# Patient Record
Sex: Female | Born: 1935 | Race: White | Hispanic: No | Smoking: Never smoker
Health system: Southern US, Community
[De-identification: ages and names within clinical notes are randomized; demographics above are authoritative.]

## PROBLEM LIST (undated history)

## (undated) DIAGNOSIS — M359 Systemic involvement of connective tissue, unspecified: Secondary | ICD-10-CM

## (undated) DIAGNOSIS — I2510 Atherosclerotic heart disease of native coronary artery without angina pectoris: Secondary | ICD-10-CM

## (undated) DIAGNOSIS — I10 Essential (primary) hypertension: Secondary | ICD-10-CM

## (undated) DIAGNOSIS — E119 Type 2 diabetes mellitus without complications: Secondary | ICD-10-CM

## (undated) DIAGNOSIS — N289 Disorder of kidney and ureter, unspecified: Secondary | ICD-10-CM

## (undated) DIAGNOSIS — J449 Chronic obstructive pulmonary disease, unspecified: Secondary | ICD-10-CM

## (undated) DIAGNOSIS — E785 Hyperlipidemia, unspecified: Secondary | ICD-10-CM

---

## 2003-10-02 ENCOUNTER — Other Ambulatory Visit: Payer: Self-pay

## 2004-06-29 ENCOUNTER — Ambulatory Visit: Payer: Self-pay | Admitting: Nurse Practitioner

## 2005-08-02 ENCOUNTER — Ambulatory Visit: Payer: Self-pay | Admitting: Nurse Practitioner

## 2006-08-08 ENCOUNTER — Ambulatory Visit: Payer: Self-pay | Admitting: Nurse Practitioner

## 2006-12-05 ENCOUNTER — Ambulatory Visit: Payer: Self-pay | Admitting: Family Medicine

## 2006-12-06 ENCOUNTER — Ambulatory Visit: Payer: Self-pay | Admitting: Family Medicine

## 2006-12-19 ENCOUNTER — Inpatient Hospital Stay (HOSPITAL_COMMUNITY): Admission: EM | Admit: 2006-12-19 | Discharge: 2006-12-25 | Payer: Self-pay | Admitting: Emergency Medicine

## 2006-12-19 ENCOUNTER — Ambulatory Visit: Payer: Self-pay | Admitting: Internal Medicine

## 2006-12-19 ENCOUNTER — Ambulatory Visit: Payer: Self-pay | Admitting: Cardiology

## 2006-12-20 ENCOUNTER — Ambulatory Visit: Payer: Self-pay | Admitting: Vascular Surgery

## 2006-12-21 ENCOUNTER — Ambulatory Visit: Payer: Self-pay | Admitting: Physical Medicine & Rehabilitation

## 2006-12-22 ENCOUNTER — Encounter: Payer: Self-pay | Admitting: Cardiology

## 2007-07-30 ENCOUNTER — Ambulatory Visit: Payer: Self-pay | Admitting: Family Medicine

## 2007-08-14 ENCOUNTER — Ambulatory Visit: Payer: Self-pay | Admitting: Family Medicine

## 2008-03-22 ENCOUNTER — Inpatient Hospital Stay: Payer: Self-pay | Admitting: Internal Medicine

## 2008-04-26 ENCOUNTER — Ambulatory Visit: Payer: Self-pay | Admitting: Geriatric Medicine

## 2010-06-29 NOTE — Consult Note (Signed)
[name], [name]               ACCOUNT NO.:  [id number]   MEDICAL RECORD NO.:  [id number]          PATIENT TYPE:  INP   LOCATION:  4706                         FACILITY:  [facility]   PHYSICIAN:  Bevelyn Buckles. Bensimhon, MDDATE OF BIRTH:  04/07/1925   DATE OF CONSULTATION:  12/20/2006  DATE OF DISCHARGE:                                 CONSULTATION   REQUESTING PHYSICIAN:  Madelaine Etienne, M.D. of the Internal Medicine  Teaching Service.   REASON FOR CONSULTATION:  Elevated troponin.   PRIMARY CARE PHYSICIAN:  Gerome Apley, M.D.   NOTE:  The patient is new to [facility].   HISTORY OF THE PRESENT ILLNESS:  The patient is a 77 year old woman with  history of hypertension, hyperlipidemia and diabetes.  She denies any  history of know heart disease.  Per the chart she has been having  recurrent syncopal episodes.  The more recent episode was when she went  out to the porch to get a newspaper, came back to her room, fell down  and hurt her left hip.  According to the chart she denies tripping over  any objects and she did not lose consciousness.  There was no evidence  of seizure activity.  I have tried to ask her about these events and she  cannot  recall anything about the event, she just states that she just  falls down.  She denies any palpitations, chest pain or shortness of  breath that she can recall with these episodes.  She came to the  hospital where he was found to have a left hip fracture.  She was seen  by orthopedics and underwent repair.  Apparently cardiac enzymes were  checked for unclear reasons and it was noted that her troponin was 0.1  postoperatively and then this evening it was checked again and it as  0.17.  Her MBs have been negative.  She has not been complaining of any  chest pain or shortness of breath.   REVIEW OF SYSTEMS:  The review of systems is notable for hip pain.  She  denies any orthopnea.  No PND.  No lower extremity edema.  No focal  neurologic  defects.  No fevers or chills.  She has had weight loss and  constipation.   PAST MEDICAL HISTORY:  The past medical history is notable for:  1. Hypertension.  2. Hyperlipidemia.  3. Diabetes.   MEDICATIONS:  The patient's medications on admission consists of:  1. Aspirin 325 mg a day.  2. Carisoprodol 1-2 tablets three times a day.  3. Glipizide 5 mg a day.  4. Lovastatin 20 mg a day.  5. Metformin 1 gram twice a day.  6. Trazodone 50 mg a day.  7. Quinapril 40 mg a day.   ALLERGIES:  The patient has no known drug allergies.   SOCIAL HISTORY:  The patient lives alone.  Her husband is in a skilled  nursing facility due to a previous stroke.  She denies any tobacco or  alcohol use.  She previously worked in a [employer] broadcast assistant.   FAMILY HISTORY:  Mother died in  her 90s due to diabetes with a  questionable history of a heart attack.  Father died at age 63 due to a  heart attack.  A brother died of  leukemia at age 51.  She has four  healthy children.   PHYSICAL EXAMINATION:  GENERAL APPEARANCE:  On physical exam she is  sitting in bed in no acute distress.  Respirations are unlabored.  She  is apparently just minimally confused.  VITAL SIGNS:  Blood pressure is 115/68 and heart rate is 110.  Temperature is 99.5.  She is sating 98% on 2 liters.  HEENT:  The head, eyes, ears, nose and throat are unremarkable.  NECK:  The neck is supple.  There is no JVD.  Carotids are 2+  bilaterally with no bruits.  There is no lymphadenopathy or thyromegaly.  HEART:  Cardiac - she is mildly tachycardiac and regular.  There is a  soft S4.  There is no murmur.  LUNGS:  The lungs are clear.  ABDOMEN:  The abdomen is soft, nontender and nondistended.  There is no  hepatosplenomegaly.  There are no bruits or masses.  Good bowel sounds.  EXTREMITIES:  The extremities are warm with no cyanosis, clubbing or  edema.  Her left leg is wrapped in a brace.  NEUROLOGIC EXAMINATION:  The patient is alert.   She knows her name.  She  knows she is in [facility].  She follows commands.  She is  unable to give a complete history.  Cranial nerves are grossly intact.  She moves her extremities without difficulty, except for her left leg,  which is in a brace secondary to her hip fracture.   LABORATORY DATA:  Labs show a total CK of 305, an MB of 1.8 and a  troponin of 0.17; this is up from a total CK of 348, MB of 2.9 and  troponin of 0.12.  Sodium was 134, potassium of 4.2, chloride of 99,  bicarb of 31, glucose of 181, BUN of 5, and creatinine 0.65.  Hemoglobin  was 10.4, white count is 11.9 and platelet count is 195,000.  Thyroid  panel is normal.  EKG showed sinus tachycardia with some PACs.  There  were no ST or T wave abnormalities.  QRS was 80 msec and QT interval was  not prolonged.  Telemetry showed sinus rhythm and sinus tachycardia with  and occasional PAC, and no significant dysrhythmia.   ASSESSMENT:  1. Minimally elevated troponin with normal MBs and normal      electrocardiogram.  2. Recurrent syncope versus falls.  3. Diabetes.  4. Hypertension.  5. Hyperlipidemia.  6. Left hip fracture.   DISCUSSION/PLAN:  The patient's troponin is only minimally elevated;  and, with her negative MBs and negative EKG this is very nonspecific;  and, I doubt that she I acutely ischemic.  The main question in my mind  is what is the cause of her recurrent syncope if this is truly syncope  versus just falls.  Her QRS is quite narrow and there is no evidence of  pauses or bradycardia on telemetry.   At this point I would recommend:  Check a 2-D echocardiogram to rule out structural heart disease.  If  this is normal we can proceed with outpatient event monitor and possible  adenosine Myoview.     Bevelyn Buckles. Bensimhon, MD  Electronically Signed    DRB/MEDQ  D:  12/21/2006  T:  12/21/2006  Job:  [id number]

## 2010-06-29 NOTE — Op Note (Signed)
NAMECRISTAL, [name]               ACCOUNT NO.:  [id number]   MEDICAL RECORD NO.:  [id number]          PATIENT TYPE:  INP   LOCATION:  1827                         FACILITY:  [facility]   PHYSICIAN:  Vania Rea. Supple, M.D.  DATE OF BIRTH:  Jan 28, 1926   DATE OF PROCEDURE:  12/19/2006  DATE OF DISCHARGE:                               OPERATIVE REPORT   PREOPERATIVE DIAGNOSIS:  Left femoral neck fracture.   POSTOPERATIVE DIAGNOSIS:  Left femoral neck fracture.   PROCEDURE:  Left hip cemented hemiarthroplasty utilizing a DePuy [place]  stem size 5 femur cemented, +0 neck length and 46 mm unipolar head.   SURGEON OF RECORD:  Vania Rea. Supple, M.D.   ASSISTANT:  French Ana Shuford PA-C   ANESTHESIA:  General endotracheal.   ESTIMATED BLOOD LOSS:  350 mL.   DRAINS:  None.   HISTORY:  [name] is a 77 year old female who has recently had  syncopal episode and unfortunately had a repeat episode earlier today at  home when she fell, injuring her left hip with subsequent complaints of  pain and inability to bear weight.  She was brought to the [facility]  emergency room where on evaluation she was found to have a foreshortened  and externally rotated left lower extremity with severe pain on attempts  at motion of the left hip.  She remained grossly neurovascular intact.  X-rays did show a displaced left femoral neck fracture.  She is brought  to the operating at this time for planned left hip hemiarthroplasty.   I preoperatively counseled [name] and family members regarding  treatment options, risks versus benefits thereof.  Possible surgical  complications of bleeding, infection, neurovascular injury, DVT, PE,  persistent pain, loss of motion and instability of the implant, possible  need for revision surgery were reviewed.  She understands, accepts and  agrees to planned procedure.   In addition, preoperatively she was evaluated and actually admitted to  medicine service.  Her medical  history was reviewed and she is felt to  be optimal for surgery.   PROCEDURE IN DETAIL:  After undergoing routine preoperative clearance,  the patient did receive prophylactic antibiotics.  Brought the operating  room, placed supine on the table and underwent smooth induction of a  general endotracheal anesthesia.  She turned to the right lateral  decubitus position on beanbag and appropriately padded and protected.  She was turned to the right lateral decubitus position and positioned  with the hip holding device.  She is properly stabilized, padded and  protected.  The left hip girdle region was then sterilely prepped and  draped in standard fashion.  A posterior approach to the left hip was  made through a 15 cm incision centered over the left greater trochanter.  Skin flaps were elevated and electrocautery was used for hemostasis.  The deep fascia was then divided in line with skin incision and gluteus  maximus fibers were split bluntly in the proximal aspect of the wound.  Self-retaining retractor was placed.  The gluteus medius and minimus  were carefully reflected anteriorly and a cobra retractor was  used to  hold the position.  The piriformis was then identified, divided and  tagged with a #1 Ethibond and reflected posteriorly.  The remaining  short external rotator musculature was also divided and reflected  posteriorly.  The capsule was then identified and T'd and the tips of  the leaflets were then tagged with a #1 Ethibond suture.  At this point  the femoral head was then identified through the fracture site and was  removed with combination of a corkscrew and ice cream scoop.  The head  was measured at between 45 and 46 mm implant.  Trial 46 head reduction  showed excellent fit.  We then carefully cleaned the acetabulum.  Attention then turned to the femoral shaft.  We used a box osteotome to  initially gain entrance to the proximal femur and then passed a canal  finder,  then sequential broachings up to a size 5.  We then used a size  5 implant to make our neck cut utilizing the calcar plantar.  We did  also utilize a trochanteric reamer to help lateralize the starting point  for the broaches.  At this point we chose a size 5 implant which was  opened.  We then placed a distal cement plug.  We went ahead and mixed,  we pulsatile lavaged, meticulously cleaned and dried the canal then  mixed cement which was the DePuy type 1 which is a very fast setting  cement.  Apparently the room was very hot and we introduced the cement  and as the implant was placed into position it only went in half way and  the cement was overly hard.  With this finding, we quickly extracted the  implant and then had to a piecemeal removal of the cement within the  canal.  We carefully removed the cement meticulously to the proper depth  and ultimately were able to remove the cement such that the implant of  properly seated.  We did obtain an intraoperative x-ray which confirmed  that the distal cement plug and small mantle of cement remained in the  femoral canal but adequate cement had removed for complete seating of  the femoral implant.  I confirmed that the femoral canal was patent.  It  was then meticulously cleaned and pulsatile lavaged and then another set  of cement using DePuy HV was then mixed and this cement was then again  introduced in retrograde fashion.  The implant was seated in position  with proper degree of anteversion at proper level and then meticulous  removal all extra cement was completed.  Once the cement had  appropriately hardened we then performed a trial reduction and this  showed excellent soft tissue balance and equal leg lengths clinically.  We then dislocated the implant, removed the trial head.  We meticulously  cleaned the acetabulum and cleaned the Morse taper of the femoral stem.  The final head with a +0 neck length was then impacted into  position.  Final reduction was performed.  Clinically leg lengths were equal.  There is excellent stability of the left hip through functional range of  motion.  Good soft tissue balance.  We then closed the hip joint capsule  with the #1 Ethibond suture.  #1 Ethibond was then used to repair the  piriformis.  The deep fascia and gluteus maximus interval was then  closed with figure-of-eight #1 Vicryl sutures.  2-0 Vicryl used for the  deeper, superficial, subcu layers and intracuticular 3-0 Monocryl  used  for the skin followed by Steri-Strips.  A dry dressing was then taped  over the left hip.  The patient was then placed supine, extubated and  taken to the recovery room in stable condition.      Vania Rea. Supple, M.D.  Electronically Signed     KMS/MEDQ  D:  12/19/2006  T:  12/20/2006  Job:  [id number]

## 2010-06-29 NOTE — Discharge Summary (Signed)
NAMEMECHILLE, VARGHESE               ACCOUNT NO.:  [id number]   MEDICAL RECORD NO.:  [id number]          PATIENT TYPE:  INP   LOCATION:  4706                         FACILITY:  [facility]   PHYSICIAN:  Ileana Roup, M.D.  DATE OF BIRTH:  1925-12-09   DATE OF ADMISSION:  12/19/2006  DATE OF DISCHARGE:  12/25/2006                               DISCHARGE SUMMARY   PRIMARY CARE PHYSICIAN:  Dr. Tama High in [place].   DISCHARGE DIAGNOSES:  1. Fall possibly due to postural hypotension related to medications.  2. Fracture of the left femoral neck, S/P left hip cemented      hemiarthroplasty by Dr. Francena Hanly on December 19, 2006.  3. Macrocytosis.  4. Leukocytosis.  5. Diabetes.  6. Anemia.  7. Hyperlipidemia.  8. Hypertension.   DISCHARGE MEDICATIONS:  1. Calcium carbonate 1 tablet twice a day.  2. Vitamin B12 1000 mcg IM injections once a day for one week and then      weekly for one month.  3. Colace 100 mg twice a day for constipation.  4. Glipizide 5 mg daily.  5. Lovastatin 20 mg daily.  6. Metformin 1 gram twice a day.  7. Vicodin 1-2 tablets per oral every 4 hours as required for pain.  8. Robaxin 500 mg per oral q.6 h. as required for stiffness.  9. Reglan 10 mg per oral q.8 h. as required for nausea and vomiting.  10.Coumadin 5 mg once daily for 2 days and adjust according to INR      measurement with a target INR of 2-3.   DISPOSITION AND FOLLOWUP:  1. The patient is being sent to the skilled nursing facility.  Her      basic medical needs in terms of primary care will be looked after      at the skilled nursing facility itself.  2. She needs to come see Dr. Rennis Chris at the [facility],      phone number [phone], on Monday, January 08, 2007 at 10:30 in the      morning.  3. She needs to continue physical therapy and occupational therapy at      the nursing home and she can weight-bear as tolerated.  She also      needs left hip dressing on a daily  basis.  4. Her primary care physician at the nursing home is advised to follow      on her hemoglobin as she has macrocytosis with anemia and she is on      B12 supplemental injections.  Her primary care physician is also      requested to keep an eye on her blood sugar.  5. The primary care physician should also consider her cardiology      referral on an outpatient basis to work up further the patient's      falls with or without syncope.  6. The patient needs to be on Coumadin for a total of 3-4 weeks.  She      is currently recommended to be on 5 mg once a day for 2 days and  her Coumadin dose will have to be adjusted based on her INR      response.  A repeat INR is recommended after 2 days.  Her target      INR is 2-3.   PROCEDURES:  1. CT head without contrast, December 19, 2006, impression:  No acute      intracranial abnormalities, atrophy, and small vessel disease.  2. Hip x-ray, December 19, 2006, impression:  Mildly displaced fracture      of the left femoral neck.  Limited study by osteopenia.  3. MRA head without contrast, December 19, 2006, impression:  Negative      intracranial MR angiography of the large and medium sized vessels.  4. Chest x-ray, December 19, 2006, impression:  Cardiomegaly and      pulmonary venous hypertension.  5. Hip x-ray, December 19, 2006, findings:  Single cross table      intraoperative view of the left hip, proximal left femur fracture      is vaguely identified.  Hip x-ray, December 19, 2006, impression:      Left hip hemiarthroplasty without complicating features.  6. Left hip cemented hemiarthroplasty by Dr. Francena Hanly on December 19, 2006.  7. Chest x-ray, December 20, 2006: no acute findings.  8. 2D Echocardiogram, December 22, 2006: Estimated right ventricular      systolic pressure 49 mmHg.  Overall left ventricular systolic      function was at the lower limits of normal.  The left ventricular      ejection fraction was estimated  to range between 50-55%.  There      were no left ventricular regional wall motion abnormalities.  There      was mild mitral annular calcification.  The estimated peak      pulmonary artery systolic pressure was moderately increased.  There      was a small pericardial effusion.   CONSULTATIONS:  1. Orthopedics, Dr. Francena Hanly regarding the left femoral neck      fracture.  She subsequently underwent a left hip cemented      hemiarthroplasty by Dr. Francena Hanly on December 19, 2006.  2. Cardiology,  Dr. Arvilla Meres, because troponin was minimally      elevated.  She had negative MB and negative EKG, so Dr. Gala Romney      thought that this was very nonspecific and was not acutely      ischemic.  Dr. Gala Romney recommended a 2D echocardiogram (see above      results) and possible followup on an outpatient basis for event      monitor and possible adenosine Myoview.   ADMISSION HISTORY AND PHYSICAL:  Ms. Roscoe is a 77 year old lady with  a past medical history of hypertension, diabetes, and hyperlipidemia who  presented to the ED with an episode of fall.  She had been to the porch  to fetch her newspaper, when once again came back to her room.  She just  fell down and hurt her left hip.  She denies tripping over or having any  aura, chest pain, palpitations, sweating.  She did not lose  consciousness at any point of time.  She denies any fits or tongue bite  or incontinence.  She denied any hemoptysis, hematemesis, melena, calf  swelling, shortness of breath, or recent immobility and travel.  The  patient has had 2 similar episodes in the recent past, each time she  fell down when she tried to  lift some object from the ground.  She  consistently denied any premonitory symptoms or aura.   PHYSICAL EXAMINATION:  VITAL SIGNS:  Temperature 97.4, blood pressure  98/60, pulse 72, respiratory rate 18, O2 sat 98% on room air.  GENERAL:  No in any apparent distress.  EYES:  No pallor, no  icterus.  Extraocular movements intact.  PERRLA.  ENT:  Moist mucous membranes.  NECK:  Supple.  No thyromegaly.  RESPIRATORY:  Clear to auscultation bilaterally.  CARDIOVASCULAR:  Regular rate and rhythm with normal heart sounds.  GI;  Soft, nontender, nondistended.  EXTREMITIES:  No edema.  No calf swelling.  GU:  No CVA tenderness.  SKIN:  Normal turgor.  No rash.  LYMPH:  No lymphadenopathy.  MUSCULOSKELETAL:  No joint or spine tenderness or swelling.  NEUROLOGIC:  Alert and oriented x3.  Cranial nerves II-XII intact.  No  motor or sensory deficits.  Cerebellar function was intact.  PSYCH:  Appropriate.   ADMISSION LABORATORY:  White cells 14.6, ANC 11.2, hemoglobin 12.6,  platelets 256, MCV 100.3.  Calcium 9.2.  Sodium 139, potassium 4.2,  chloride 102, bicarb 28, BUN 12, creatinine 0.62, and glucose 109.  EKG:  Borderline left axis deviation.  Urinalysis:  Trace leukocytes.   HOSPITAL COURSE:  1. Fall.  We did undertake a workup as regards to finding the cause of      her fall and it is possibly related to orthostatic drop in blood      pressure secondary to her medications, namely trazodone, quinapril,      and soma which we have all held for the time being.  Her cardiac      and neuro workup were within normal limits, no acute findings by CT      and MRI and normal cardiac enzymes and no new EKG changes, except      that she had only had a borderline troponin I elevation as listed      by Dr. Gala Romney from cardiology and event monitoring and Myoview      testing could be of consideration on an outpatient basis.  2. Left femoral neck fracture.  She underwent left hip cemented      hemiarthroplasty by Dr. Francena Hanly on December 19, 2006.  She had      a fairly uneventful recovery following surgery, except that her      hemoglobin dropped a couple of units after surgery, but it remained      stable after that.  She also complained of nausea and vomiting      afterwards but  it subsided with antiemetics.  3. Macrocytosis.  The patient was noted to have a macrocytosis of      about 100 MCV.  Her B12, TSH, and folate were within normal limits;      however, her methylmalonic acid level was elevated at 550 which was      nearly 2 times the upper limit.  Hence, we started her on B12      injections which she needs to continue on a daily basis for one      week, followed by weekly injections for one month.  The patient's      primary care physician is to review it afterwards for long term      needs.  4. Diabetes.  The patient's A1c was 5.9%.  We simply placed her on      Lantus and sliding scale insulin.  She was transferred  back to her      usual oral medications upon discharge.  5. Leukocytosis.  The patient presented with a high white cell count.      She did not have any fever or any other signs of infection.  We      will presume it was likely related to her stress.  It gradually      came down on its own and she remained afebrile throughout her      hospital stay.   DISCHARGE VITAL SIGNS:  Temperature is 96.9, pulse 95, respirations 28,  blood pressure 135/54, 02 sat 98% on 2 liters.   DISCHARGE LABORATORY:  Sodium 140, potassium 4.7, chloride 104, bicarb  31, glucose 157, BUN 9, creatinine 0.54, calcium 8.4.  INR 1.9.  White  cells 6.6, hemoglobin 9.4, MCV 101, platelets 254.      Zara Council, MD  Electronically Signed      Ileana Roup, M.D.  Electronically Signed   AS/MEDQ  D:  12/25/2006  T:  12/25/2006  Job:  [id number]

## 2010-11-23 LAB — BASIC METABOLIC PANEL
BUN: 12
BUN: 5 — ABNORMAL LOW
BUN: 7
BUN: 9
BUN: 9
CO2: 29
CO2: 30
CO2: 31
CO2: 31
CO2: 32
Calcium: 8.1 — ABNORMAL LOW
Calcium: 8.2 — ABNORMAL LOW
Calcium: 8.2 — ABNORMAL LOW
Calcium: 8.3 — ABNORMAL LOW
Calcium: 8.4
Calcium: 9.2
Chloride: 102
Chloride: 102
Chloride: 104
Creatinine, Ser: 0.54
Creatinine, Ser: 0.62
Creatinine, Ser: 0.64
Creatinine, Ser: 0.65
Creatinine, Ser: 0.68
Creatinine, Ser: 0.69
GFR calc Af Amer: >60
GFR calc Af Amer: >60
GFR calc Af Amer: >60
GFR calc Af Amer: >60
GFR calc Af Amer: >60
GFR calc Af Amer: >60
GFR calc Af Amer: >60
GFR calc non Af Amer: >60
GFR calc non Af Amer: >60
GFR calc non Af Amer: >60
GFR calc non Af Amer: >60
Glucose, Bld: 157 — ABNORMAL HIGH
Glucose, Bld: 181 — ABNORMAL HIGH
Glucose, Bld: 186 — ABNORMAL HIGH
Potassium: 3.9
Potassium: 4.2
Sodium: 134 — ABNORMAL LOW
Sodium: 139
Sodium: 139
Sodium: 139

## 2010-11-23 LAB — CBC
HCT: 26.3 — ABNORMAL LOW
HCT: 28.1 — ABNORMAL LOW
HCT: 29.1 — ABNORMAL LOW
Hemoglobin: 10.4 — ABNORMAL LOW
Hemoglobin: 10.8 — ABNORMAL LOW
Hemoglobin: 12.6
Hemoglobin: 9 — ABNORMAL LOW
MCHC: 33.3
MCHC: 33.7
MCHC: 33.7
MCHC: 33.8
MCHC: 34
MCHC: 34.1
MCHC: 34.1
MCHC: 34.2
MCHC: 34.3
MCV: 100.3 — ABNORMAL HIGH
MCV: 100.9 — ABNORMAL HIGH
MCV: 101 — ABNORMAL HIGH
MCV: 101.6 — ABNORMAL HIGH
MCV: 99.4
MCV: 99.4
Platelets: 170
Platelets: 182
Platelets: 189
Platelets: 193
Platelets: 195
Platelets: 228
Platelets: 229
RBC: 2.62 — ABNORMAL LOW
RBC: 2.68 — ABNORMAL LOW
RBC: 2.76 — ABNORMAL LOW
RBC: 2.83 — ABNORMAL LOW
RBC: 2.86 — ABNORMAL LOW
RBC: 3.04 — ABNORMAL LOW
RBC: 3.13 — ABNORMAL LOW
RDW: 12.4
RDW: 12.5
RDW: 12.5
RDW: 12.5
RDW: 12.5
RDW: 12.6
RDW: 12.7
WBC: 10.5
WBC: 12.5 — ABNORMAL HIGH
WBC: 13.7 — ABNORMAL HIGH
WBC: 14 — ABNORMAL HIGH
WBC: 8.2

## 2010-11-23 LAB — HEMOGLOBIN A1C
Hgb A1c MFr Bld: 5.9
Mean Plasma Glucose: 133

## 2010-11-23 LAB — COMPREHENSIVE METABOLIC PANEL
ALT: 13
Albumin: 3.2 — ABNORMAL LOW
Alkaline Phosphatase: 104
BUN: 9
CO2: 27
Chloride: 102
Creatinine, Ser: 0.61
GFR calc Af Amer: >60
Glucose, Bld: 110 — ABNORMAL HIGH
Potassium: 3.9
Sodium: 138
Total Bilirubin: 0.7
Total Protein: 5.9 — ABNORMAL LOW

## 2010-11-23 LAB — VITAMIN B12: Vitamin B-12: 272 (ref 211–911)

## 2010-11-23 LAB — CARDIAC PANEL(CRET KIN+CKTOT+MB+TROPI)
CK, MB: 1.8
CK, MB: 2.9
Total CK: 305 — ABNORMAL HIGH
Total CK: 348 — ABNORMAL HIGH
Total CK: 403 — ABNORMAL HIGH
Troponin I: 0.07 — ABNORMAL HIGH
Troponin I: 0.12 — ABNORMAL HIGH
Troponin I: 0.17 — ABNORMAL HIGH

## 2010-11-23 LAB — CROSSMATCH
ABO/RH(D): A POS
Antibody Screen: NEGATIVE

## 2010-11-23 LAB — URINALYSIS, ROUTINE W REFLEX MICROSCOPIC
Hgb urine dipstick: NEGATIVE
Nitrite: NEGATIVE
pH: 7

## 2010-11-23 LAB — PROTIME-INR
INR: 1
INR: 1.1
INR: 1.9 — ABNORMAL HIGH
INR: 2.2 — ABNORMAL HIGH
Prothrombin Time: 13.3
Prothrombin Time: 14.3
Prothrombin Time: 22.4 — ABNORMAL HIGH
Prothrombin Time: 25 — ABNORMAL HIGH

## 2010-11-23 LAB — SAMPLE TO BLOOD BANK

## 2010-11-23 LAB — LIPID PANEL: Cholesterol: 124

## 2010-11-23 LAB — DIFFERENTIAL
Basophils Absolute: 0
Lymphocytes Relative: 16
Lymphs Abs: 2.3
Neutro Abs: 11.2 — ABNORMAL HIGH
Neutrophils Relative %: 80 — ABNORMAL HIGH

## 2010-11-23 LAB — CULTURE, BLOOD (ROUTINE X 2): Culture: NO GROWTH

## 2010-11-23 LAB — URINE MICROSCOPIC-ADD ON

## 2010-11-23 LAB — URINE CULTURE: Culture: NO GROWTH

## 2011-01-26 ENCOUNTER — Ambulatory Visit: Payer: Self-pay | Admitting: Internal Medicine

## 2011-02-15 ENCOUNTER — Ambulatory Visit: Payer: Self-pay | Admitting: Internal Medicine

## 2011-03-16 LAB — CBC CANCER CENTER
Eosinophil #: 0.1 x10 3/mm (ref 0.0–0.7)
Eosinophil %: 0.9 %
Lymphocyte #: 4.2 x10 3/mm — ABNORMAL HIGH (ref 1.0–3.6)
MCH: 32.5 pg (ref 26.0–34.0)
MCHC: 33.6 g/dL (ref 32.0–36.0)
MCV: 97 fL (ref 80–100)
Monocyte #: 0.5 x10 3/mm (ref 0.0–0.7)
Neutrophil %: 58 %
Platelet: 274 x10 3/mm (ref 150–440)
RBC: 3.82 10*6/uL (ref 3.80–5.20)
RDW: 12.8 % (ref 11.5–14.5)

## 2011-03-18 ENCOUNTER — Ambulatory Visit: Payer: Self-pay | Admitting: Internal Medicine

## 2011-07-21 ENCOUNTER — Ambulatory Visit: Payer: Self-pay | Admitting: Oncology

## 2011-07-21 LAB — CBC CANCER CENTER
Basophil %: 0.5 %
Eosinophil #: 0.1 x10 3/mm (ref 0.0–0.7)
HCT: 38.6 % (ref 35.0–47.0)
Lymphocyte #: 3.9 x10 3/mm — ABNORMAL HIGH (ref 1.0–3.6)
Lymphocyte %: 33 %
MCH: 31.2 pg (ref 26.0–34.0)
MCHC: 32.2 g/dL (ref 32.0–36.0)
MCV: 97 fL (ref 80–100)
Monocyte #: 0.4 x10 3/mm (ref 0.2–0.9)
Platelet: 239 x10 3/mm (ref 150–440)
RDW: 13.3 % (ref 11.5–14.5)

## 2011-08-15 ENCOUNTER — Ambulatory Visit: Payer: Self-pay | Admitting: Oncology

## 2011-10-09 ENCOUNTER — Inpatient Hospital Stay: Payer: Self-pay | Admitting: Orthopedic Surgery

## 2011-10-09 ENCOUNTER — Ambulatory Visit: Payer: Self-pay | Admitting: Orthopaedic Surgery

## 2011-10-09 LAB — COMPREHENSIVE METABOLIC PANEL
Alkaline Phosphatase: 30 U/L — ABNORMAL LOW (ref 50–136)
Anion Gap: 10 (ref 7–16)
Calcium, Total: <5 mg/dL — CL (ref 8.5–10.1)
Chloride: 122 mmol/L — ABNORMAL HIGH (ref 98–107)
Co2: 18 mmol/L — ABNORMAL LOW (ref 21–32)
Creatinine: 0.58 mg/dL — ABNORMAL LOW (ref 0.60–1.30)
Osmolality: 299 (ref 275–301)
SGOT(AST): 8 U/L — ABNORMAL LOW (ref 15–37)
SGPT (ALT): <6 U/L — ABNORMAL LOW (ref 12–78)
Total Protein: 3.1 g/dL — ABNORMAL LOW (ref 6.4–8.2)

## 2011-10-09 LAB — IRON AND TIBC: Unbound Iron-Bind.Cap.: 62 ug/dL

## 2011-10-09 LAB — URINALYSIS, COMPLETE
Bilirubin,UR: NEGATIVE
Blood: NEGATIVE
Glucose,UR: 50 mg/dL (ref 0–75)
Hyaline Cast: 6
Ph: 5 (ref 4.5–8.0)
Protein: NEGATIVE
Squamous Epithelial: 1

## 2011-10-09 LAB — CBC
HCT: 22.6 % — ABNORMAL LOW (ref 35.0–47.0)
MCV: 96 fL (ref 80–100)
Platelet: 150 10*3/uL (ref 150–440)
RBC: 2.36 10*6/uL — ABNORMAL LOW (ref 3.80–5.20)
WBC: 11.7 10*3/uL — ABNORMAL HIGH (ref 3.6–11.0)

## 2011-10-09 LAB — TROPONIN I: Troponin-I: <0.02 ng/mL

## 2011-10-10 LAB — BASIC METABOLIC PANEL
Anion Gap: 10 (ref 7–16)
Calcium, Total: 8.1 mg/dL — ABNORMAL LOW (ref 8.5–10.1)
Chloride: 102 mmol/L (ref 98–107)
Co2: 27 mmol/L (ref 21–32)
EGFR (African American): 54 — ABNORMAL LOW
Sodium: 139 mmol/L (ref 136–145)

## 2011-10-10 LAB — CBC WITH DIFFERENTIAL/PLATELET
Basophil %: 0.4 %
Eosinophil %: 0 %
HCT: 33.1 % — ABNORMAL LOW (ref 35.0–47.0)
HGB: 11.2 g/dL — ABNORMAL LOW (ref 12.0–16.0)
Lymphocyte #: 2.8 10*3/uL (ref 1.0–3.6)
MCV: 96 fL (ref 80–100)
Monocyte %: 6.2 %
Neutrophil #: 9.6 10*3/uL — ABNORMAL HIGH (ref 1.4–6.5)
RBC: 3.46 10*6/uL — ABNORMAL LOW (ref 3.80–5.20)
WBC: 13.3 10*3/uL — ABNORMAL HIGH (ref 3.6–11.0)

## 2011-10-11 LAB — BASIC METABOLIC PANEL
Calcium, Total: 7.4 mg/dL — ABNORMAL LOW (ref 8.5–10.1)
Chloride: 105 mmol/L (ref 98–107)
Co2: 26 mmol/L (ref 21–32)
Creatinine: 0.9 mg/dL (ref 0.60–1.30)
EGFR (African American): >60
EGFR (Non-African Amer.): 59 — ABNORMAL LOW

## 2011-10-11 LAB — CBC WITH DIFFERENTIAL/PLATELET
Basophil %: 0.2 %
HCT: 24 % — ABNORMAL LOW (ref 35.0–47.0)
HGB: 8.1 g/dL — ABNORMAL LOW (ref 12.0–16.0)
Lymphocyte %: 20.4 %
MCHC: 33.7 g/dL (ref 32.0–36.0)
Monocyte %: 8.2 %
Neutrophil #: 7.1 10*3/uL — ABNORMAL HIGH (ref 1.4–6.5)

## 2011-10-11 LAB — HEMOGLOBIN: HGB: 7.6 g/dL — ABNORMAL LOW (ref 12.0–16.0)

## 2011-10-13 ENCOUNTER — Encounter: Payer: Self-pay | Admitting: Internal Medicine

## 2011-10-13 LAB — HEMOGLOBIN: HGB: 7.9 g/dL — ABNORMAL LOW (ref 12.0–16.0)

## 2011-10-16 ENCOUNTER — Encounter: Payer: Self-pay | Admitting: Internal Medicine

## 2011-10-27 LAB — URINALYSIS, COMPLETE
Bilirubin,UR: NEGATIVE
Blood: NEGATIVE
Leukocyte Esterase: NEGATIVE
Nitrite: NEGATIVE
Ph: 6 (ref 4.5–8.0)
RBC,UR: 4 /HPF (ref 0–5)
Squamous Epithelial: 5

## 2011-10-28 LAB — COMPREHENSIVE METABOLIC PANEL
Albumin: 2.8 g/dL — ABNORMAL LOW (ref 3.4–5.0)
Anion Gap: 8 (ref 7–16)
BUN: 10 mg/dL (ref 7–18)
Bilirubin,Total: 1.1 mg/dL — ABNORMAL HIGH (ref 0.2–1.0)
Chloride: 104 mmol/L (ref 98–107)
Creatinine: 0.64 mg/dL (ref 0.60–1.30)
EGFR (African American): >60
Glucose: 147 mg/dL — ABNORMAL HIGH (ref 65–99)
Potassium: 4.2 mmol/L (ref 3.5–5.1)
SGOT(AST): 11 U/L — ABNORMAL LOW (ref 15–37)
Sodium: 140 mmol/L (ref 136–145)
Total Protein: 6 g/dL — ABNORMAL LOW (ref 6.4–8.2)

## 2011-10-28 LAB — CBC WITH DIFFERENTIAL/PLATELET
Basophil %: 0.9 %
Eosinophil #: 0 10*3/uL (ref 0.0–0.7)
Eosinophil %: 0.3 %
HCT: 34.4 % — ABNORMAL LOW (ref 35.0–47.0)
HGB: 11.7 g/dL — ABNORMAL LOW (ref 12.0–16.0)
Lymphocyte #: 2.5 10*3/uL (ref 1.0–3.6)
Lymphocyte %: 24.2 %
MCH: 33.7 pg (ref 26.0–34.0)
MCHC: 34 g/dL (ref 32.0–36.0)
Monocyte #: 0.7 x10 3/mm (ref 0.2–0.9)

## 2011-10-29 LAB — URINE CULTURE

## 2011-11-15 ENCOUNTER — Encounter: Payer: Self-pay | Admitting: Internal Medicine

## 2011-12-16 ENCOUNTER — Encounter: Payer: Self-pay | Admitting: Internal Medicine

## 2012-05-16 ENCOUNTER — Emergency Department: Payer: Self-pay | Admitting: Emergency Medicine

## 2012-05-16 LAB — URINALYSIS, COMPLETE
Bilirubin,UR: NEGATIVE
Ketone: NEGATIVE
Protein: NEGATIVE
RBC,UR: 7 /HPF (ref 0–5)
Specific Gravity: 1.008 (ref 1.003–1.030)
WBC UR: 26 /HPF (ref 0–5)

## 2012-05-16 LAB — COMPREHENSIVE METABOLIC PANEL
Albumin: 3 g/dL — ABNORMAL LOW (ref 3.4–5.0)
Alkaline Phosphatase: 77 U/L (ref 50–136)
Anion Gap: 6 — ABNORMAL LOW (ref 7–16)
BUN: 11 mg/dL (ref 7–18)
Chloride: 108 mmol/L — ABNORMAL HIGH (ref 98–107)
Creatinine: 0.77 mg/dL (ref 0.60–1.30)
EGFR (African American): >60
Glucose: 161 mg/dL — ABNORMAL HIGH (ref 65–99)
Osmolality: 282 (ref 275–301)
Potassium: 4.2 mmol/L (ref 3.5–5.1)
Total Protein: 6.1 g/dL — ABNORMAL LOW (ref 6.4–8.2)

## 2012-05-16 LAB — CBC
HCT: 35.8 % (ref 35.0–47.0)
HGB: 12.1 g/dL (ref 12.0–16.0)
MCHC: 33.8 g/dL (ref 32.0–36.0)
MCV: 98 fL (ref 80–100)
RBC: 3.64 10*6/uL — ABNORMAL LOW (ref 3.80–5.20)

## 2012-09-01 ENCOUNTER — Inpatient Hospital Stay: Payer: Self-pay | Admitting: Internal Medicine

## 2012-09-01 LAB — BASIC METABOLIC PANEL
Chloride: 97 mmol/L — ABNORMAL LOW (ref 98–107)
EGFR (African American): >60
Glucose: 167 mg/dL — ABNORMAL HIGH (ref 65–99)
Osmolality: 262 (ref 275–301)
Potassium: 4.4 mmol/L (ref 3.5–5.1)

## 2012-09-01 LAB — HEPATIC FUNCTION PANEL A (ARMC)
Albumin: 3.1 g/dL — ABNORMAL LOW (ref 3.4–5.0)
Alkaline Phosphatase: 100 U/L (ref 50–136)
Bilirubin, Direct: 0.1 mg/dL (ref 0.00–0.20)
SGPT (ALT): 10 U/L — ABNORMAL LOW (ref 12–78)

## 2012-09-01 LAB — CBC
HCT: 35.3 % (ref 35.0–47.0)
HGB: 12.2 g/dL (ref 12.0–16.0)
MCV: 96 fL (ref 80–100)
Platelet: 267 10*3/uL (ref 150–440)
RDW: 12.5 % (ref 11.5–14.5)

## 2012-09-01 LAB — TROPONIN I: Troponin-I: <0.02 ng/mL

## 2012-09-02 LAB — BASIC METABOLIC PANEL
Anion Gap: 4 — ABNORMAL LOW (ref 7–16)
BUN: 14 mg/dL (ref 7–18)
Calcium, Total: 9 mg/dL (ref 8.5–10.1)
Chloride: 96 mmol/L — ABNORMAL LOW (ref 98–107)
Creatinine: 0.84 mg/dL (ref 0.60–1.30)
EGFR (African American): >60
Glucose: 253 mg/dL — ABNORMAL HIGH (ref 65–99)

## 2012-09-02 LAB — CBC WITH DIFFERENTIAL/PLATELET
HCT: 34.3 % — ABNORMAL LOW (ref 35.0–47.0)
Lymphocyte %: 15.1 %
MCH: 32.6 pg (ref 26.0–34.0)
MCHC: 33.6 g/dL (ref 32.0–36.0)
MCV: 97 fL (ref 80–100)
Monocyte %: 1.1 %
Neutrophil %: 83.6 %
RDW: 12.6 % (ref 11.5–14.5)

## 2012-09-13 ENCOUNTER — Inpatient Hospital Stay: Payer: Self-pay | Admitting: Internal Medicine

## 2012-09-13 LAB — URINALYSIS, COMPLETE
Bilirubin,UR: NEGATIVE
Glucose,UR: >=500 mg/dL (ref 0–75)
Ketone: NEGATIVE
Nitrite: NEGATIVE
Ph: 6 (ref 4.5–8.0)
Protein: NEGATIVE
RBC,UR: 18 /HPF (ref 0–5)
WBC UR: 26 /HPF (ref 0–5)

## 2012-09-13 LAB — CBC
HCT: 41.2 % (ref 35.0–47.0)
MCHC: 34.3 g/dL (ref 32.0–36.0)
Platelet: 224 10*3/uL (ref 150–440)
RDW: 12.5 % (ref 11.5–14.5)
WBC: 19.5 10*3/uL — ABNORMAL HIGH (ref 3.6–11.0)

## 2012-09-13 LAB — COMPREHENSIVE METABOLIC PANEL
Albumin: 3.3 g/dL — ABNORMAL LOW (ref 3.4–5.0)
Anion Gap: 6 — ABNORMAL LOW (ref 7–16)
Calcium, Total: 9.6 mg/dL (ref 8.5–10.1)
Chloride: 93 mmol/L — ABNORMAL LOW (ref 98–107)
Co2: 28 mmol/L (ref 21–32)
Creatinine: 1.14 mg/dL (ref 0.60–1.30)
Osmolality: 284 (ref 275–301)
SGOT(AST): 10 U/L — ABNORMAL LOW (ref 15–37)

## 2012-09-13 LAB — CK TOTAL AND CKMB (NOT AT ARMC): CK, Total: 34 U/L (ref 21–215)

## 2012-09-13 LAB — TROPONIN I: Troponin-I: <0.02 ng/mL

## 2012-09-14 LAB — BASIC METABOLIC PANEL
Anion Gap: 6 — ABNORMAL LOW (ref 7–16)
Chloride: 98 mmol/L (ref 98–107)
Creatinine: 0.91 mg/dL (ref 0.60–1.30)
EGFR (African American): >60
EGFR (Non-African Amer.): 58 — ABNORMAL LOW
Potassium: 4 mmol/L (ref 3.5–5.1)
Sodium: 134 mmol/L — ABNORMAL LOW (ref 136–145)

## 2012-09-14 LAB — TROPONIN I
Troponin-I: 0.03 ng/mL
Troponin-I: 0.02 ng/mL

## 2012-09-14 LAB — CBC WITH DIFFERENTIAL/PLATELET
Basophil #: 0 10*3/uL (ref 0.0–0.1)
Basophil %: 0.1 %
Eosinophil %: 0.3 %
Lymphocyte #: 4.8 10*3/uL — ABNORMAL HIGH (ref 1.0–3.6)
Lymphocyte %: 22.1 %
MCH: 33.4 pg (ref 26.0–34.0)
MCHC: 34.9 g/dL (ref 32.0–36.0)
MCV: 96 fL (ref 80–100)
Neutrophil #: 15.2 10*3/uL — ABNORMAL HIGH (ref 1.4–6.5)

## 2012-09-16 LAB — CBC WITH DIFFERENTIAL/PLATELET
Basophil #: 0 10*3/uL (ref 0.0–0.1)
Basophil %: 0.2 %
Eosinophil #: 0.2 10*3/uL (ref 0.0–0.7)
Eosinophil %: 1.2 %
HCT: 35 % (ref 35.0–47.0)
MCHC: 34.8 g/dL (ref 32.0–36.0)
MCV: 96 fL (ref 80–100)
Monocyte #: 0.7 x10 3/mm (ref 0.2–0.9)
Monocyte %: 5.2 %
Neutrophil #: 9.4 10*3/uL — ABNORMAL HIGH (ref 1.4–6.5)
Platelet: 145 10*3/uL — ABNORMAL LOW (ref 150–440)
RBC: 3.64 10*6/uL — ABNORMAL LOW (ref 3.80–5.20)

## 2012-09-16 LAB — BASIC METABOLIC PANEL
Anion Gap: 5 — ABNORMAL LOW (ref 7–16)
BUN: 17 mg/dL (ref 7–18)
Calcium, Total: 8.7 mg/dL (ref 8.5–10.1)
Chloride: 99 mmol/L (ref 98–107)
Creatinine: 0.82 mg/dL (ref 0.60–1.30)
EGFR (African American): >60
Potassium: 4 mmol/L (ref 3.5–5.1)
Sodium: 134 mmol/L — ABNORMAL LOW (ref 136–145)

## 2012-09-17 LAB — BASIC METABOLIC PANEL
Anion Gap: 6 — ABNORMAL LOW (ref 7–16)
Calcium, Total: 8.7 mg/dL (ref 8.5–10.1)
Chloride: 99 mmol/L (ref 98–107)
Co2: 27 mmol/L (ref 21–32)
EGFR (Non-African Amer.): >60
Osmolality: 268 (ref 275–301)
Potassium: 4.2 mmol/L (ref 3.5–5.1)
Sodium: 132 mmol/L — ABNORMAL LOW (ref 136–145)

## 2012-09-19 LAB — BASIC METABOLIC PANEL
Anion Gap: 9 (ref 7–16)
BUN: 18 mg/dL (ref 7–18)
Calcium, Total: 8.4 mg/dL — ABNORMAL LOW (ref 8.5–10.1)
Chloride: 93 mmol/L — ABNORMAL LOW (ref 98–107)
Creatinine: 0.85 mg/dL (ref 0.60–1.30)
EGFR (Non-African Amer.): >60
Glucose: 223 mg/dL — ABNORMAL HIGH (ref 65–99)
Osmolality: 271 (ref 275–301)

## 2012-09-19 LAB — CBC WITH DIFFERENTIAL/PLATELET
Basophil #: 0 10*3/uL (ref 0.0–0.1)
Basophil %: 0.3 %
Eosinophil #: 0 10*3/uL (ref 0.0–0.7)
Eosinophil %: 0.1 %
HGB: 13.1 g/dL (ref 12.0–16.0)
Lymphocyte #: 2.8 10*3/uL (ref 1.0–3.6)
Lymphocyte %: 16 %
MCH: 33.1 pg (ref 26.0–34.0)
MCHC: 34.4 g/dL (ref 32.0–36.0)
Monocyte #: 1 x10 3/mm — ABNORMAL HIGH (ref 0.2–0.9)
Monocyte %: 5.7 %
Neutrophil #: 13.8 10*3/uL — ABNORMAL HIGH (ref 1.4–6.5)
WBC: 17.7 10*3/uL — ABNORMAL HIGH (ref 3.6–11.0)

## 2012-09-19 LAB — CEA: CEA: 1.8 ng/mL (ref 0.0–4.7)

## 2012-09-19 LAB — HEPATIC FUNCTION PANEL A (ARMC)
Albumin: 2.3 g/dL — ABNORMAL LOW (ref 3.4–5.0)
Bilirubin, Direct: 0.1 mg/dL (ref 0.00–0.20)
SGOT(AST): 10 U/L — ABNORMAL LOW (ref 15–37)

## 2012-09-19 LAB — URINALYSIS, COMPLETE
Bacteria: NONE SEEN
Blood: NEGATIVE
Glucose,UR: 150 mg/dL (ref 0–75)
Nitrite: NEGATIVE
Ph: 5 (ref 4.5–8.0)
Protein: 100
Squamous Epithelial: 4

## 2012-09-19 LAB — CANCER ANTIGEN 19-9: CA 19-9: <1 U/mL (ref 0–35)

## 2012-09-20 LAB — BASIC METABOLIC PANEL
Anion Gap: 6 — ABNORMAL LOW (ref 7–16)
BUN: 25 mg/dL — ABNORMAL HIGH (ref 7–18)
Calcium, Total: 8.1 mg/dL — ABNORMAL LOW (ref 8.5–10.1)
EGFR (African American): >60
Osmolality: 273 (ref 275–301)
Sodium: 134 mmol/L — ABNORMAL LOW (ref 136–145)

## 2012-09-20 LAB — CBC WITH DIFFERENTIAL/PLATELET
Basophil %: 0.2 %
HCT: 34.6 % — ABNORMAL LOW (ref 35.0–47.0)
Lymphocyte %: 26.5 %
MCH: 33.6 pg (ref 26.0–34.0)
MCHC: 34.8 g/dL (ref 32.0–36.0)
MCV: 97 fL (ref 80–100)
Monocyte %: 6.4 %
RBC: 3.58 10*6/uL — ABNORMAL LOW (ref 3.80–5.20)
WBC: 12.7 10*3/uL — ABNORMAL HIGH (ref 3.6–11.0)

## 2012-09-21 LAB — URINE CULTURE

## 2012-09-23 LAB — CBC WITH DIFFERENTIAL/PLATELET
Basophil %: 0.5 %
Eosinophil #: 0.1 10*3/uL (ref 0.0–0.7)
Eosinophil %: 2.2 %
HCT: 32.6 % — ABNORMAL LOW (ref 35.0–47.0)
HGB: 11.6 g/dL — ABNORMAL LOW (ref 12.0–16.0)
Lymphocyte %: 30.5 %
Monocyte #: 0.5 x10 3/mm (ref 0.2–0.9)
Neutrophil #: 4 10*3/uL (ref 1.4–6.5)
Neutrophil %: 59.1 %
Platelet: 214 10*3/uL (ref 150–440)
RBC: 3.38 10*6/uL — ABNORMAL LOW (ref 3.80–5.20)
RDW: 13 % (ref 11.5–14.5)

## 2012-09-24 LAB — PATHOLOGY REPORT

## 2012-10-04 ENCOUNTER — Ambulatory Visit: Payer: Self-pay | Admitting: Gastroenterology

## 2012-10-18 ENCOUNTER — Ambulatory Visit: Payer: Self-pay | Admitting: Gastroenterology

## 2013-04-29 ENCOUNTER — Encounter: Payer: Self-pay | Admitting: Internal Medicine

## 2013-05-07 LAB — BASIC METABOLIC PANEL
ANION GAP: 4 — AB (ref 7–16)
BUN: 28 mg/dL — ABNORMAL HIGH (ref 7–18)
CREATININE: 0.91 mg/dL (ref 0.60–1.30)
Calcium, Total: 8.6 mg/dL (ref 8.5–10.1)
Chloride: 100 mmol/L (ref 98–107)
Co2: 31 mmol/L (ref 21–32)
EGFR (African American): >60
EGFR (Non-African Amer.): 58 — ABNORMAL LOW
Glucose: 190 mg/dL — ABNORMAL HIGH (ref 65–99)
Osmolality: 281 (ref 275–301)
POTASSIUM: 4.3 mmol/L (ref 3.5–5.1)
Sodium: 135 mmol/L — ABNORMAL LOW (ref 136–145)

## 2013-05-07 LAB — HEMOGLOBIN A1C: Hemoglobin A1C: 9.2 % — ABNORMAL HIGH (ref 4.2–6.3)

## 2013-05-15 ENCOUNTER — Encounter: Payer: Self-pay | Admitting: Internal Medicine

## 2013-06-14 ENCOUNTER — Encounter: Payer: Self-pay | Admitting: Internal Medicine

## 2013-07-15 ENCOUNTER — Encounter: Payer: Self-pay | Admitting: Internal Medicine

## 2013-08-13 LAB — BASIC METABOLIC PANEL
Anion Gap: 11 (ref 7–16)
BUN: 28 mg/dL — ABNORMAL HIGH (ref 7–18)
CHLORIDE: 104 mmol/L (ref 98–107)
CO2: 25 mmol/L (ref 21–32)
Calcium, Total: 9 mg/dL (ref 8.5–10.1)
Creatinine: 0.57 mg/dL — ABNORMAL LOW (ref 0.60–1.30)
EGFR (Non-African Amer.): >60
GLUCOSE: 83 mg/dL (ref 65–99)
Osmolality: 284 (ref 275–301)
Potassium: 4.3 mmol/L (ref 3.5–5.1)
Sodium: 140 mmol/L (ref 136–145)

## 2013-08-14 ENCOUNTER — Encounter: Payer: Self-pay | Admitting: Internal Medicine

## 2013-09-03 LAB — LIPID PANEL
Cholesterol: 136 mg/dL (ref 0–200)
HDL: 54 mg/dL (ref 40–60)
Ldl Cholesterol, Calc: 56 mg/dL (ref 0–100)
Triglycerides: 130 mg/dL (ref 0–200)
VLDL Cholesterol, Calc: 26 mg/dL (ref 5–40)

## 2013-09-03 LAB — HEMOGLOBIN A1C: Hemoglobin A1C: 7.9 % — ABNORMAL HIGH (ref 4.2–6.3)

## 2013-09-14 ENCOUNTER — Encounter: Payer: Self-pay | Admitting: Internal Medicine

## 2013-10-15 ENCOUNTER — Encounter: Payer: Self-pay | Admitting: Internal Medicine

## 2013-11-14 ENCOUNTER — Other Ambulatory Visit: Payer: Self-pay | Admitting: Gerontology

## 2013-11-14 ENCOUNTER — Encounter: Payer: Self-pay | Admitting: Internal Medicine

## 2013-11-14 LAB — COMPREHENSIVE METABOLIC PANEL
ALK PHOS: 51 U/L
AST: 10 U/L — AB (ref 15–37)
Albumin: 2.8 g/dL — ABNORMAL LOW (ref 3.4–5.0)
Anion Gap: 8 (ref 7–16)
BILIRUBIN TOTAL: 0.5 mg/dL (ref 0.2–1.0)
BUN: 27 mg/dL — ABNORMAL HIGH (ref 7–18)
CHLORIDE: 103 mmol/L (ref 98–107)
CO2: 29 mmol/L (ref 21–32)
CREATININE: 0.91 mg/dL (ref 0.60–1.30)
Calcium, Total: 8.7 mg/dL (ref 8.5–10.1)
EGFR (Non-African Amer.): >60
Glucose: 101 mg/dL — ABNORMAL HIGH (ref 65–99)
Osmolality: 285 (ref 275–301)
Potassium: 3.8 mmol/L (ref 3.5–5.1)
SGPT (ALT): 12 U/L — ABNORMAL LOW
SODIUM: 140 mmol/L (ref 136–145)
Total Protein: 6.6 g/dL (ref 6.4–8.2)

## 2013-11-14 LAB — CBC WITH DIFFERENTIAL/PLATELET
BASOS PCT: 0.3 %
Basophil #: 0 10*3/uL (ref 0.0–0.1)
EOS ABS: 0.2 10*3/uL (ref 0.0–0.7)
EOS PCT: 1.5 %
HCT: 39.1 % (ref 35.0–47.0)
HGB: 12.6 g/dL (ref 12.0–16.0)
LYMPHS PCT: 24.5 %
Lymphocyte #: 3.4 10*3/uL (ref 1.0–3.6)
MCH: 32.1 pg (ref 26.0–34.0)
MCHC: 32.3 g/dL (ref 32.0–36.0)
MCV: 100 fL (ref 80–100)
MONO ABS: 0.8 x10 3/mm (ref 0.2–0.9)
Monocyte %: 5.4 %
NEUTROS PCT: 68.3 %
Neutrophil #: 9.5 10*3/uL — ABNORMAL HIGH (ref 1.4–6.5)
Platelet: 285 10*3/uL (ref 150–440)
RBC: 3.93 10*6/uL (ref 3.80–5.20)
RDW: 12.3 % (ref 11.5–14.5)
WBC: 13.9 10*3/uL — AB (ref 3.6–11.0)

## 2013-11-14 LAB — TSH: Thyroid Stimulating Horm: 2.85 u[IU]/mL

## 2013-12-15 ENCOUNTER — Encounter: Payer: Self-pay | Admitting: Internal Medicine

## 2014-01-14 ENCOUNTER — Encounter: Payer: Self-pay | Admitting: Internal Medicine

## 2014-02-16 ENCOUNTER — Inpatient Hospital Stay: Payer: Self-pay | Admitting: Internal Medicine

## 2014-02-16 LAB — CBC
HCT: 37.3 % (ref 35.0–47.0)
HGB: 12.3 g/dL (ref 12.0–16.0)
MCH: 32.7 pg (ref 26.0–34.0)
MCHC: 33 g/dL (ref 32.0–36.0)
MCV: 99 fL (ref 80–100)
Platelet: 322 10*3/uL (ref 150–440)
RBC: 3.77 10*6/uL — ABNORMAL LOW (ref 3.80–5.20)
RDW: 12.9 % (ref 11.5–14.5)
WBC: 21.3 10*3/uL — ABNORMAL HIGH (ref 3.6–11.0)

## 2014-02-16 LAB — BASIC METABOLIC PANEL
Anion Gap: 9 (ref 7–16)
BUN: 28 mg/dL — ABNORMAL HIGH (ref 7–18)
CHLORIDE: 98 mmol/L (ref 98–107)
CO2: 28 mmol/L (ref 21–32)
Calcium, Total: 8.8 mg/dL (ref 8.5–10.1)
Creatinine: 1.18 mg/dL (ref 0.60–1.30)
EGFR (African American): 56 — ABNORMAL LOW
EGFR (Non-African Amer.): 46 — ABNORMAL LOW
Glucose: 386 mg/dL — ABNORMAL HIGH (ref 65–99)
Osmolality: 292 (ref 275–301)
Potassium: 4.2 mmol/L (ref 3.5–5.1)
SODIUM: 135 mmol/L — AB (ref 136–145)

## 2014-02-16 LAB — TROPONIN I
Troponin-I: 0.45 ng/mL — ABNORMAL HIGH
Troponin-I: 1.2 ng/mL — ABNORMAL HIGH
Troponin-I: 2 ng/mL — ABNORMAL HIGH

## 2014-02-16 LAB — URINALYSIS, COMPLETE
BACTERIA: NONE SEEN
Bilirubin,UR: NEGATIVE
Bilirubin,UR: NEGATIVE
Blood: NEGATIVE
Glucose,UR: >=500 mg/dL (ref 0–75)
KETONE: NEGATIVE
KETONE: NEGATIVE
LEUKOCYTE ESTERASE: NEGATIVE
Leukocyte Esterase: NEGATIVE
Nitrite: NEGATIVE
Nitrite: NEGATIVE
PH: 5 (ref 4.5–8.0)
Ph: 5 (ref 4.5–8.0)
Protein: NEGATIVE
Protein: NEGATIVE
RBC,UR: <1 /HPF (ref 0–5)
SPECIFIC GRAVITY: 1.02 (ref 1.003–1.030)
Specific Gravity: 1.013 (ref 1.003–1.030)
Squamous Epithelial: 1

## 2014-02-16 LAB — PRO B NATRIURETIC PEPTIDE: B-Type Natriuretic Peptide: 6203 pg/mL — ABNORMAL HIGH (ref 0–450)

## 2014-02-16 LAB — INFLUENZA A,B,H1N1 - PCR (ARMC)
H1N1 flu by pcr: NOT DETECTED
Influenza A By PCR: NEGATIVE
Influenza B By PCR: NEGATIVE

## 2014-02-16 LAB — CK TOTAL AND CKMB (NOT AT ARMC)
CK, Total: 157 U/L (ref 26–192)
CK-MB: 2.2 ng/mL (ref 0.5–3.6)

## 2014-02-16 LAB — PROTIME-INR
INR: 1.1
Prothrombin Time: 14.1 secs (ref 11.5–14.7)

## 2014-02-17 LAB — CBC WITH DIFFERENTIAL/PLATELET
Basophil #: 0.1 10*3/uL (ref 0.0–0.1)
Basophil %: 0.3 %
EOS PCT: 0 %
Eosinophil #: 0 10*3/uL (ref 0.0–0.7)
HCT: 34.5 % — ABNORMAL LOW (ref 35.0–47.0)
HGB: 11.3 g/dL — ABNORMAL LOW (ref 12.0–16.0)
Lymphocyte #: 2.1 10*3/uL (ref 1.0–3.6)
Lymphocyte %: 11.4 %
MCH: 32.4 pg (ref 26.0–34.0)
MCHC: 32.6 g/dL (ref 32.0–36.0)
MCV: 99 fL (ref 80–100)
MONOS PCT: 1.9 %
Monocyte #: 0.4 x10 3/mm (ref 0.2–0.9)
NEUTROS PCT: 86.4 %
Neutrophil #: 15.9 10*3/uL — ABNORMAL HIGH (ref 1.4–6.5)
PLATELETS: 309 10*3/uL (ref 150–440)
RBC: 3.47 10*6/uL — AB (ref 3.80–5.20)
RDW: 12.9 % (ref 11.5–14.5)
WBC: 18.4 10*3/uL — AB (ref 3.6–11.0)

## 2014-02-17 LAB — BASIC METABOLIC PANEL
Anion Gap: 8 (ref 7–16)
BUN: 33 mg/dL — ABNORMAL HIGH (ref 7–18)
CALCIUM: 8.8 mg/dL (ref 8.5–10.1)
CO2: 27 mmol/L (ref 21–32)
Chloride: 101 mmol/L (ref 98–107)
Creatinine: 1.05 mg/dL (ref 0.60–1.30)
EGFR (Non-African Amer.): 53 — ABNORMAL LOW
GLUCOSE: 228 mg/dL — AB (ref 65–99)
Osmolality: 286 (ref 275–301)
Potassium: 4.3 mmol/L (ref 3.5–5.1)
Sodium: 136 mmol/L (ref 136–145)

## 2014-02-18 LAB — CBC WITH DIFFERENTIAL/PLATELET
BASOS ABS: 0 10*3/uL (ref 0.0–0.1)
Basophil %: 0 %
EOS PCT: 0 %
Eosinophil #: 0 10*3/uL (ref 0.0–0.7)
HCT: 33 % — ABNORMAL LOW (ref 35.0–47.0)
HGB: 10.9 g/dL — ABNORMAL LOW (ref 12.0–16.0)
Lymphocyte #: 1.8 10*3/uL (ref 1.0–3.6)
Lymphocyte %: 11.8 %
MCH: 32.7 pg (ref 26.0–34.0)
MCHC: 33.1 g/dL (ref 32.0–36.0)
MCV: 99 fL (ref 80–100)
MONO ABS: 0.3 x10 3/mm (ref 0.2–0.9)
MONOS PCT: 2.2 %
NEUTROS PCT: 86 %
Neutrophil #: 12.8 10*3/uL — ABNORMAL HIGH (ref 1.4–6.5)
Platelet: 279 10*3/uL (ref 150–440)
RBC: 3.34 10*6/uL — ABNORMAL LOW (ref 3.80–5.20)
RDW: 12.8 % (ref 11.5–14.5)
WBC: 14.9 10*3/uL — AB (ref 3.6–11.0)

## 2014-02-18 LAB — BASIC METABOLIC PANEL
Anion Gap: 5 — ABNORMAL LOW (ref 7–16)
BUN: 33 mg/dL — AB (ref 7–18)
CHLORIDE: 100 mmol/L (ref 98–107)
CO2: 29 mmol/L (ref 21–32)
CREATININE: 0.97 mg/dL (ref 0.60–1.30)
Calcium, Total: 9 mg/dL (ref 8.5–10.1)
GFR CALC NON AF AMER: 58 — AB
Glucose: 178 mg/dL — ABNORMAL HIGH (ref 65–99)
OSMOLALITY: 280 (ref 275–301)
Potassium: 4.4 mmol/L (ref 3.5–5.1)
Sodium: 134 mmol/L — ABNORMAL LOW (ref 136–145)

## 2014-02-18 LAB — URINE CULTURE

## 2014-02-19 LAB — BASIC METABOLIC PANEL
Anion Gap: 7 (ref 7–16)
BUN: 28 mg/dL — ABNORMAL HIGH (ref 7–18)
CHLORIDE: 102 mmol/L (ref 98–107)
CO2: 29 mmol/L (ref 21–32)
Calcium, Total: 9.1 mg/dL (ref 8.5–10.1)
Creatinine: 0.85 mg/dL (ref 0.60–1.30)
EGFR (Non-African Amer.): >60
Glucose: 108 mg/dL — ABNORMAL HIGH (ref 65–99)
Osmolality: 282 (ref 275–301)
Potassium: 4.5 mmol/L (ref 3.5–5.1)
SODIUM: 138 mmol/L (ref 136–145)

## 2014-02-19 LAB — CBC WITH DIFFERENTIAL/PLATELET
Basophil #: 0.1 10*3/uL (ref 0.0–0.1)
Basophil %: 0.4 %
EOS PCT: 0.1 %
Eosinophil #: 0 10*3/uL (ref 0.0–0.7)
HCT: 34.1 % — AB (ref 35.0–47.0)
HGB: 11.1 g/dL — AB (ref 12.0–16.0)
LYMPHS PCT: 11.2 %
Lymphocyte #: 1.5 10*3/uL (ref 1.0–3.6)
MCH: 32.3 pg (ref 26.0–34.0)
MCHC: 32.6 g/dL (ref 32.0–36.0)
MCV: 99 fL (ref 80–100)
MONOS PCT: 2.9 %
Monocyte #: 0.4 x10 3/mm (ref 0.2–0.9)
NEUTROS PCT: 85.4 %
Neutrophil #: 11.6 10*3/uL — ABNORMAL HIGH (ref 1.4–6.5)
PLATELETS: 254 10*3/uL (ref 150–440)
RBC: 3.44 10*6/uL — AB (ref 3.80–5.20)
RDW: 12.5 % (ref 11.5–14.5)
WBC: 13.6 10*3/uL — ABNORMAL HIGH (ref 3.6–11.0)

## 2014-02-20 ENCOUNTER — Encounter: Payer: Self-pay | Admitting: Internal Medicine

## 2014-02-21 LAB — CULTURE, BLOOD (SINGLE)

## 2014-03-06 LAB — HEMOGLOBIN A1C: HEMOGLOBIN A1C: 8.9 % — AB (ref 4.2–6.3)

## 2014-03-17 ENCOUNTER — Encounter: Payer: Self-pay | Admitting: Internal Medicine

## 2014-04-15 ENCOUNTER — Encounter
Admit: 2014-04-15 | Disposition: A | Discharge status: Home / Self Care | Payer: Self-pay | Attending: Internal Medicine | Admitting: Internal Medicine

## 2014-05-09 LAB — CBC WITH DIFFERENTIAL/PLATELET
Basophil #: 0.1 10*3/uL (ref 0.0–0.1)
Basophil %: 0.4 %
Eosinophil #: 0.2 10*3/uL (ref 0.0–0.7)
Eosinophil %: 1.7 %
HCT: 35.3 % (ref 35.0–47.0)
HGB: 11.8 g/dL — ABNORMAL LOW (ref 12.0–16.0)
LYMPHS ABS: 3.7 10*3/uL — AB (ref 1.0–3.6)
Lymphocyte %: 26.1 %
MCH: 32.5 pg (ref 26.0–34.0)
MCHC: 33.4 g/dL (ref 32.0–36.0)
MCV: 97 fL (ref 80–100)
MONO ABS: 0.8 x10 3/mm (ref 0.2–0.9)
Monocyte %: 5.6 %
Neutrophil #: 9.4 10*3/uL — ABNORMAL HIGH (ref 1.4–6.5)
Neutrophil %: 66.2 %
Platelet: 260 10*3/uL (ref 150–440)
RBC: 3.63 10*6/uL — ABNORMAL LOW (ref 3.80–5.20)
RDW: 14.2 % (ref 11.5–14.5)
WBC: 14.1 10*3/uL — ABNORMAL HIGH (ref 3.6–11.0)

## 2014-05-09 LAB — COMPREHENSIVE METABOLIC PANEL
ALT: 8 U/L — AB
AST: 12 U/L — AB
Albumin: 2.9 g/dL — ABNORMAL LOW
Alkaline Phosphatase: 55 U/L
Anion Gap: 8 (ref 7–16)
BUN: 26 mg/dL — AB
Bilirubin,Total: 0.5 mg/dL
CHLORIDE: 98 mmol/L — AB
Calcium, Total: 9.3 mg/dL
Co2: 32 mmol/L
Creatinine: 0.69 mg/dL
GLUCOSE: 104 mg/dL — AB
POTASSIUM: 3.8 mmol/L
Sodium: 138 mmol/L
TOTAL PROTEIN: 6.2 g/dL — AB

## 2014-05-16 ENCOUNTER — Encounter
Admit: 2014-05-16 | Disposition: A | Discharge status: Home / Self Care | Payer: Self-pay | Attending: Internal Medicine | Admitting: Internal Medicine

## 2014-06-03 NOTE — Discharge Summary (Signed)
PATIENT NAME:  [name], [name] MR#:  [id number] DATE OF BIRTH:  [birth year]  DATE OF ADMISSION:  10/09/2011 DATE OF DISCHARGE:  10/13/2011  ADMITTING DIAGNOSIS: Right subtrochanteric femur fracture, comminuted.   DISCHARGE DIAGNOSIS: Right subtrochanteric femur fracture, comminuted.   PROCEDURE: Open reduction internal fixation, right proximal femur.   ANESTHESIA: Spinal.   SURGEON: Leitha Schuller, M.D.   ESTIMATED BLOOD LOSS: 300 mL.   COMPLICATIONS: None.   SPECIMEN: None.   IMPLANTS: Biomet Affix 130 degree femoral rod 11 x 320 mm, 90 mm lag screw, and two 5.0 locking screws.   HISTORY: Patient is a very pleasant 81 year old female who was in her usual state of health today, was ambulating at home, went around a corner, lost her balance and slipped and fell onto right lower extremity. She was unable to ambulate after the fall. She was brought to the Emergency Department for evaluation. Emergency Department personnel did an x-ray and found her to have a right subtrochanteric femur fracture and I was consulted. In discussion with the patient, she had no previous leg pain. She had no previous surgery on the leg. She lives at home and ambulates with a walker secondary to previous left hip surgery. She denies any open wounds or bleeding at the time of the injury.   PHYSICAL EXAMINATION: VITAL SIGNS: She is afebrile. Vital signs are stable. HEENT: Pupils equal, round, and reactive to light. Mucous membranes are moist. NECK: Supple with no JVD. CHEST: Clear to auscultation bilaterally. HEART: No murmurs, gallops, or rubs. ABDOMEN: Soft, nontender, nondistended. LOWER EXTREMITY: Shortened and externally rotated right lower extremity with significant pain with any motion of the right lower extremity. Held in flexed position, she is able to plantar flex, dorsiflex and extend her great toe. Her sensation is intact throughout the right lower extremity. Cap refill is less than 2 seconds. There are no  noted open wounds or bleeding or exposed bone noted throughout the lower extremity.   HOSPITAL COURSE: Patient was admitted to the hospital on 10/09/2011. She was found to have right hip subtrochanteric hip fracture comminuted. While getting medical clearance found her electrolytes were out of balance and she was anemic at 7.6. Medicine jumped in to help monitor and fixed these with the patient. Medicine also continued to monitor and control patient's diabetes, hypertension. On 10/10/2011 patient's electrolytes were stable and she was ready for surgery. She had surgery on 10/10/2011 and she was brought to the orthopedic floor from the PAC-U in stable condition. On postoperative day one and two patient's hemoglobin did continue to trend down. She had slow progress with physical therapy which was thought to be due to lethargy. On postoperative day three patient's hemoglobin had dropped to 7.9 and she was given 1 unit of packed red blood cells. Throughout patient's stay patient's vital signs were stable. Her diabetes and hypertension were controlled. On 10/12/2011 patient was stable and ready for discharge to rehab.   DISCHARGE INSTRUCTIONS: Patient is to follow up with [facility] orthopedics in two weeks. She is to be evaluated and treated for difficulty walking. She is a right partial weight-bearing. Also OT should evaluate and treat patient for assistance with activities of daily living. She needs to be on a carbohydrate-controlled diet. Activity is partial weight bearing.   DISCHARGE MEDICATIONS:  1. Vicodin 5/325, 1 to 2 tablets q.4-6 hours p.r.n. pain. 2. Tylenol 500 mg oral q.4 hours p.r.n. pain or temperature greater than 100.4, to exceed 4000 grams of Tylenol in a 24-hour  period. 3. Docusate calcium 240 mg oral at bedtime.  4. Bisacodyl 10 mg rectal every six hours p.r.n. constipation. 5. Zantac 150 mg oral every 12 hours. 6. Quinapril 20 mg oral daily. 7. Pravastatin 20 mg oral at bedtime. 8. Metformin  1000 mg oral b.i.d.  9. Neurontin 100 mg oral b.i.d.  10. Citalopram 10 mg oral daily.  11. Insulin SS Novolin R injection subcutaneous FSBS before meals and at bedtime. If FSBS 0 to 200 no insulin, 4 units if FSBS 201 to 250, 6 units if FSBS 251 to 300, 8 units if FSBS 301 to 350, 10 units if FSBS 351 to 400.  12. Lovenox 30 mg subcutaneous every 12 hours x14 days. 13. Lopressor 12.5 mg oral q.12 hours.  14. Vitamin B complex with iron intrinsic factor 1 capsule oral twice a day. 15. Oxygen 2 liters if sats less than 92. Make discontinue in the morning if sats greater than 90.   ____________________________ Evon Slackhomas C. Ciara Kagan, PA-C tcg:cms D: 10/13/2011 07:08:33 ET T: 10/13/2011 08:32:53 ET JOB#: [id number]  cc: Evon Slackhomas C. Josiane Labine, PA-C, <Dictator> Evon SlackHOMAS C Redell Nazir PA ELECTRONICALLY SIGNED 10/24/2011 14:14

## 2014-06-03 NOTE — Op Note (Signed)
PATIENT NAME:  Ashley BrochureSHIVELY, Katalyn W MR#:  [id number] DATE OF BIRTH:  20-Aug-1925  DATE OF PROCEDURE:  10/10/2011  PREOPERATIVE DIAGNOSIS: Right subtrochanteric femur fracture, comminuted.   POSTOPERATIVE DIAGNOSIS: Right subtrochanteric femur fracture, comminuted.  PROCEDURE: Open reduction internal fixation right proximal femur.   ANESTHESIA: Spinal.   SURGEON: Leitha SchullerMichael J. Shekinah Pitones, MD    DESCRIPTION OF PROCEDURE: The patient was brought to the operating room and after adequate anesthesia was obtained, the patient was placed on the fracture table with the left leg in a well legholder and the right foot in a traction boot with just slight traction applied. After appropriate position on the fracture table, C-arm was brought in. After adequate visualization in both AP and lateral projections was obtained, the hip was then prepped and draped with the barrier drape method. Initially the fracture site was opened because of significant displacement of the fracture. Reduction clamps were used to obtain  anatomic reduction at the fracture site. A second incision was made proximal to the greater trochanter and  a guide wire was inserted through the tip of the trochanter in the center on the lateral view with the proximal fracture being significantly externally rotated. This was somewhat more posterior than usual. With a guidewire inserted, proximal reaming was carried out. It was difficult to get a guidewire down the canal because of comminution of the fracture site with manipulation of the fracture site and it giving some additional traction. To restore leg length a guidewire could be passed. Reaming was carried out to 13 mm and 320 x 11 mm Affixus rod was inserted, 130 degrees. It went down the canal without much difficulty getting it to the appropriate level and then reduction clamps were removed. The reduction appeared stable on AP and lateral images without any reduction clamp placed with only the rod holding it in  position. Going through the previous incision for the open reduction, a guidewire was inserted using the drill guide for the 130 degree implant and placed in a center side position on the head. This measured 90 mm. Reaming was carried out and then placing the 90 mm lag screw. This was then tightened and the proximal insertion handle was removed. The leg was abducted at this point to allow for visualization of the distal screw holes. Freehand technique was used with small incisions made, drilling, measuring, and placing the 5.0 cortical screws. Two screws were placed without difficulty. Imaging was then taken to make sure the screws were at the appropriate depth and they were. C-arm views Carm images were saved. The wounds were irrigated and then closed with 0 Vicryl for deep fascia, 2-0 Vicryl subcutaneously and skin staples. Xeroform, 4 x 4's, ABD, and tape were applied. The patient was taken to the recovery room in stable condition.   ESTIMATED BLOOD LOSS: 300 mL.   COMPLICATIONS: None.   SPECIMEN: None.   IMPLANT: Biomet Affixus 130 degree femoral rod with 11 x 320 mm, 90 mm lag screw, and two 5.0 locking screws.   ____________________________ Leitha SchullerMichael J. Jaime Dome, MD mjm:drc D: 10/10/2011 16:30:55 ET T: 10/10/2011 16:46:00 ET JOB#: [id number]  cc: Leitha SchullerMichael J. Tylee Newby, MD, <Dictator> Leitha SchullerMICHAEL J Amica Harron MD ELECTRONICALLY SIGNED 10/11/2011 7:28

## 2014-06-03 NOTE — H&P (Signed)
PATIENT NAME:  [name], [name] MR#:  [id number] DATE OF BIRTH:  [birth year]  DATE OF ADMISSION:  10/09/2011  CHIEF COMPLAINT: Right leg pain.  HISTORY OF PRESENT ILLNESS: This is a very pleasant 81 year old female who was in her usual state of health today, was ambulating at home, went around a corner, lost her balance and slipped and fell onto her right lower extremity. She was unable to ambulate after the fall. She was brought to the emergency department for evaluation.  Emergency department personnel did an x-ray and evaluated her and found her to have a right subtrochanteric femur fracture and I was consulted. In discussion with the patient, she had no previous leg pain. She has had no previous surgery on this leg.  She lives at home and ambulates with a walker secondary to previous left hip surgery.  She denies any open wounds or bleeding at the time of the injury.   PAST MEDICAL HISTORY:  1. Anemia. 2. Type 2 diabetes. 3. Hypertension. 4. Hyperlipidemia.  PAST SURGICAL HISTORY:  1. Hernia surgery. 2. Left hip surgery.  DRUG ALLERGIES: No known drug allergies.  MEDICATIONS: 1. Citalopram 10 mg one tablet daily. 2. Difenoxalate atropine 40 mg four times daily p.r.n. diarrhea. 3. Gabapentin 100 mg twice a day. 4. Metformin 1000 mg oral twice a day. 5. Omeprazole 20 mg every morning. 6. Pravastatin 20 mg p.o. at bedtime.  7. Quinapril 20 mg p.o. daily. 8. Ranitidine 150 mg p.o. daily.  REVIEW OF SYSTEMS: HEENT: She has no ringing in the ears but is hard of hearing. RESPIRATORY: She denies coughing, fevers, or chest pain. CARDIAC: Denies any history of cardiac disease. GI: Denies any liver disease. GU: Denies any kidney disease. MUSCULOSKELETAL: As noted above.   PHYSICAL EXAMINATION:   VITALS: She is afebrile, vital signs are stable.   HEENT: Pupils are equal, round, and reactive to light. Mucous membranes are moist.   NECK: Supple with no JVD.   CHEST: Clear to auscultation  bilaterally.   HEART: No murmurs, gallops, or rubs.   ABDOMEN: Soft, nontender, and nondistended.   LOWER EXTREMITIES: Shortened, externally rotated right lower extremity with significant pain with any motion of the right lower extremity. Held in a flexed position, she is able to plantar flex, dorsi flex, and extend her great toe. Her sensation is intact throughout the right lower extremity. Capillary refill is less than 2 seconds. There are no noted open wound or bleeding or exposed bone noted throughout the right lower extremity.   LAB RESULTS: Troponin is negative. INR 1.2. White count is 11.7, hemoglobin 7.6, and hematocrit 22.6 with a platelet count of 150.  BMP shows a sodium of 150, potassium 2.6, chloride 122, bicarbonate 18, BUN 9, creatinine 0.58, and glucose 140.  RADIOLOGIC RESULTS: X-ray of the right femur shows an oblique fracture of the right subtrochanteric region of the femur.   ASSESSMENT: This is a very pleasant 81 year old female with a right subtrochanteric femur fracture.      PLAN: We will admit her. We will get a hospitalist consultation for further evaluation and operating room clearance. We will make her NPO past midnight tonight for possible surgical intervention tomorrow. She will be nonweightbearing on that lower extremity and on bedrest.  ____________________________ Dimas Alexandriaoberto D. Jeanelle Mallingalderon, MD rdc:slb D: 10/09/2011 22:36:36 ET T: 10/10/2011 07:54:44 ET JOB#: [id number]  cc: Dimas Alexandriaoberto D. Jeanelle Mallingalderon, MD, <Dictator> Francis GainesOBERTO D Jasime Westergren MD ELECTRONICALLY SIGNED 10/20/2011 17:48

## 2014-06-03 NOTE — Consult Note (Signed)
[facility] Preop medical clearanceis OK to go for surgery once her electrlytes are stabilized  . she is at intermediate risk for surgeryh/o MI,CADis functional at home. she walks with a walker due to her left hip s/p sugery in the past Anemia7.6. will send off iron studies and hemoccult stoolsone unit PRBC tonite Dm-2 controlled at home per [facility] log bookmetformin post op and SSI quinapril  on pravastatin hypernatremia/hyperchloremia/hypokalemianeed to replace K  and cont IVmag pt's dtr dr Jeanelle Mallingcalderon   Electronic Signatures: Willow OraPatel, Hristopher Missildine A (MD) (Signed on 25-Aug-13 22:24)  Authored   Last Updated: 25-Aug-13 22:30 by Willow OraPatel, Romeo Zielinski A (MD)

## 2014-06-03 NOTE — Consult Note (Signed)
PATIENT NAME:  Ashley, [name] MR#:  [id number] DATE OF BIRTH:  1925-09-21  DATE OF CONSULTATION:  10/09/2011  REFERRING PHYSICIAN:  Marlyn Corporal, MD  CONSULTING PHYSICIAN:  [name] A. Posey Pronto, MD PRIMARY CARE PHYSICIAN: [name]  REASON FOR CONSULTATION: Preop medical clearance. The patient sustained a right femoral fracture.   HISTORY OF PRESENT ILLNESS: Ms. [name] is an 81 year old Caucasian female who lives at home with her daughter, comes to the Emergency Room after she tripped over the cat, fell down and started having pain in her right thigh. X-rays in the Emergency Room showed fracture of femur.  Internal Medicine is consulted for  medical clearance and abnormal admission labs. Ms. [name] denies any history of coronary artery disease or any history of myocardial infarction. She never had any cardiac work-up as far as she remembers as outpatient. She is otherwise functional at home, walks with a walker given her left hip fracture, and postsurgery never gained her strength back completely again.   PAST MEDICAL HISTORY:  1. History of pelvic fracture, status post fall.  2. Left hip fracture in 2009.  3. Hypertension.  4. Type 2 diabetes.  5. Hyperlipidemia.  6. History of vitamin B12 deficiency, was on B12 shots monthly which have been discontinued. 7. The patient has history of chronic leukocytosis for which she follows up with [name]. No work-up currently needed.   ALLERGIES: No known drug allergies.   MEDICATIONS:  1. Calcium with vitamin D 1 tablet b.i.d.  2. Citalopram 10 mg daily.  3. Gabapentin 100 mg b.i.d.  4. Metformin 1000 mg b.i.d.  5. Pravastatin 20 mg once a day in the evening.  6. Quinapril 20 mg daily.  7. Ranitidine 150 b.i.d.   PAST SURGICAL HISTORY:  1. Appendectomy.  2. Hysterectomy.  3. Cataract surgery.  4. Left hip surgery.   SOCIAL HISTORY: She lives at home with her daughter. No tobacco or alcohol use.   FAMILY HISTORY: Leukemia, and  sister died of multiple myeloma.    REVIEW OF SYSTEMS: CONSTITUTIONAL: Positive for fatigue and weakness. EYES: No blurred or double vision or glaucoma. ENT: No tinnitus, ear pain, hearing loss. RESPIRATORY: No cough, wheeze, hemoptysis, or chronic obstructive pulmonary disease. CARDIOVASCULAR: No chest pain, orthopnea, or edema. Positive for hypertension. GI: No nausea, vomiting, diarrhea, abdominal pain. No gastroesophageal reflux disease. GENITOURINARY: No dysuria or hematuria. ENDOCRINE: No polyuria or nocturia or thyroid problems. HEMATOLOGY: Positive for anemia. SKIN: No acne or rash. MUSCULOSKELETAL: Positive for hip pain and back pain. NEUROLOGIC: No cerebrovascular accident or transient ischemic attack. PSYCHIATRIC: No anxiety or depression. All other systems reviewed and negative.   PHYSICAL EXAMINATION:  GENERAL: The patient is awake, alert, oriented x3. She is a little hard of hearing. She has overall generalized pallor.   VITAL SIGNS: She is afebrile, pulse is 156/93, sats are 94% on room air, pulse is 80.   HEENT: Atraumatic, normocephalic. Pupils are equal, round, and reactive to light and accommodation. Extraocular movements are intact. Oral mucosa is moist.   NECK: Supple. No JVD. No carotid bruit.   LUNGS: Clear to auscultation bilaterally. No rales, rhonchi, respiratory distress, or labored breathing.   HEART: Both the heart sounds are normal. Rate, rhythm is regular. PMI not lateralized. Chest nontender.  EXTREMITIES: Good pedal pulses, good femoral pulses. No lower extremity edema. The patient does have the right lower shortened and internally rotated.   NEUROLOGIC: Grossly intact cranial nerves II through XII. No motor or sensory deficit other than  the fractured thigh which has decreased range of motion.   PSYCHIATRIC: The patient is awake, alert, oriented x3.   SKIN: Warm and dry, generalized pallor. CK total is 35, MB is 0.7. Glucose is 140, BUN 9, creatinine 0.5, sodium  150, potassium is 2.6, chloride is 122, bicarbonate is 18, bilirubin total is 0.2, alkaline phosphatases is 30, total protein is 3.1. WBC is 11.7, hemoglobin and hematocrit  is 7.6 and 22.6, platelet count is 150 MCV is 96. PT-INR 16.0 and 1.2. Troponin is 0.02. EKG is still pending.   ASSESSMENT: [name] is an 81 year old with:  1. Preop medical clearance: The patient is okay to go for surgery once her electrolytes are stabilized. She is at intermediate risk for surgery. She does not have any history of myocardial infarction or coronary artery disease. She is functional at home, walks with a walker due to her left hip status post surgery in the past. The patient's EKG shows normal sinus rhythm with sinus arrhythmia. Nonspecific ST-T changes.  2. Anemia: Hemoglobin is 7.6. We will send iron studies, Hemoccult stools and vitamin B12 levels. We will transfuse 1 unit prior to surgery. We will defer this to Orthopedics to put in the orders for transfusion.  3. Type 2 diabetes: Well controlled at home per blood glucose logbook. Continue metformin postoperatively and continue sliding scale insulin.  4. Hypertension: Continue quinapril.  5. Hypernatremia, hyperchloremia and hypokalemia: We will need to replace p.o. and IV potassium. We will continue IV fluids for now. Check magnesium. Follow up electrolytes to ensure they are stabilized prior to surgery. Above was discussed.  6. Deep vein thrombosis prophylaxis: Per Orthopedics.    The above was discussed with the patient and the patient's daughter, who are agreeable to it. The risks and complications of surgery were discussed. The above also was discussed with [name].   Thank you for the consult. We will follow while the patient is in house.     TIME SPENT:   50 minutes.  ____________________________ Ashley Rochester Posey Pronto, MD sap:cbb D: 10/09/2011 22:40:22 ET T: 10/10/2011 10:07:41 ET JOB#: [id number]  cc: Ashley Erno A. Posey Pronto, MD, <Dictator> Ashley  [name] Ashley Basset MD ELECTRONICALLY SIGNED 10/18/2011 15:44

## 2014-06-06 NOTE — Consult Note (Signed)
Chief Complaint:  Subjective/Chief Complaint No more nausea or vomiting. On protonix bid.   VITAL SIGNS/ANCILLARY NOTES: **Vital Signs.:   07-Aug-14 03:49  Vital Signs Type Routine  Temperature Temperature (F) 97.9  Celsius 36.6  Temperature Source oral  Pulse Pulse 66  Respirations Respirations 18  Systolic BP Systolic BP 333  Diastolic BP (mmHg) Diastolic BP (mmHg) 51  Mean BP 70  Pulse Ox % Pulse Ox % 90  Pulse Ox Activity Level  At rest  Oxygen Delivery Room Air/ 21 %   Brief Assessment:  GEN no acute distress   Cardiac Regular   Respiratory clear BS   Gastrointestinal Normal   Lab Results: Routine Chem:  07-Aug-14 04:55   Glucose, Serum  112  BUN  25  Creatinine (comp) 0.95  Sodium, Serum  134  Potassium, Serum 3.5  Chloride, Serum 98  CO2, Serum 30  Calcium (Total), Serum  8.1  Anion Gap  6  Osmolality (calc) 273  eGFR (African American) >60  eGFR (Non-African American)  55 (eGFR values <48m/min/1.73 m2 may be an indication of chronic kidney disease (CKD). Calculated eGFR is useful in patients with stable renal function. The eGFR calculation will not be reliable in acutely ill patients when serum creatinine is changing rapidly. It is not useful in  patients on dialysis. The eGFR calculation may not be applicable to patients at the low and high extremes of body sizes, pregnant women, and vegetarians.)  Routine Hem:  07-Aug-14 04:55   WBC (CBC)  12.7  RBC (CBC)  3.58  Hemoglobin (CBC) 12.0  Hematocrit (CBC)  34.6  Platelet Count (CBC) 194  MCV 97  MCH 33.6  MCHC 34.8  RDW 13.1  Neutrophil % 66.2  Lymphocyte % 26.5  Monocyte % 6.4  Eosinophil % 0.7  Basophil % 0.2  Neutrophil #  8.4  Lymphocyte # 3.4  Monocyte # 0.8  Eosinophil # 0.1  Basophil # 0.0 (Result(s) reported on 20 Sep 2012 at 05:29AM.)   Assessment/Plan:  Assessment/Plan:  Assessment Reflux esophagitis. Gastric and duodenal ulcer. On protonix bid.   Plan See yest's  recommendation. Advance diet as tolerated. Full liquid diet ordered. Pt can proceed with GB surgery whenever surgery plans. WIll sign off. Thanks.   Electronic Signatures: OVerdie Shire(MD)  (Signed 07-Aug-14 08:12)  Authored: Chief Complaint, VITAL SIGNS/ANCILLARY NOTES, Brief Assessment, Lab Results, Assessment/Plan   Last Updated: 07-Aug-14 08:12 by OVerdie Shire(MD)

## 2014-06-06 NOTE — Consult Note (Signed)
Pt seen and examined. Please see Dawn Harrison's notes. EGD showed signif reflux esophagitis, small gastric ulcer, and small duodenal ulcer. Biopsies taken for H.pylori and esophageal bx taken for reflux esophagitis. These can contribute to pt's nausea/vomiting. Clear liquid resumed. Advance as tolerated. Increase protonix to bid for minimum 1 month and then once daily PPI afterwards.. thanks   Electronic Signatures: Lutricia Feilh, Bryer Cozzolino (MD) (Signed on 06-Aug-14 16:06)  Authored   Last Updated: 07-Aug-14 07:49 by Lutricia Feilh, Verlyn Lambert (MD)

## 2014-06-06 NOTE — H&P (Signed)
PATIENT NAME:  [name], [name] MR#:  [id number] DATE OF BIRTH:  01/19/1926  DATE OF ADMISSION:  09/13/2012  PRIMARY CARE PHYSICIAN: At the [facility].   CHIEF COMPLAINT: Talking out of her head.   HISTORY OF PRESENT ILLNESS: This is an 81 year old female who was recently in the hospital on 09/01/2012 to 09/05/2012 and treated for reactive airway disease and sent home on Levaquin and a prednisone taper. The family at the bedside in the ER. They have noticed her talking out of her head, seeing and hearing things, seeing a fire in the room, hearing loud noises that are not there. She thought her daughter was somebody else. Yesterday she did have an episode of diarrhea and did vomit.   The patient is not the best historian and has poor hearing, so history was difficult, but she did say some things that did not make complete sense to me.   In the ER, she was found to have a high white count of 19.5, a high sugar of 511, sodium of 127, a positive urinalysis, and hospitalist services were contacted for further evaluation.   PAST MEDICAL HISTORY: Hypertension, diabetes, hyperlipidemia, B12 deficiency, a history of leukocytosis, a recent hospital admission for reactive airway disease, possible history of diastolic congestive heart failure.   PAST SURGICAL HISTORY: Hip fracture surgery, hernia, C-section.   ALLERGIES: No known drug allergies.   MEDICATIONS: As per prescription writer include Symbicort 2 puffs twice a day, calcium and vitamin D 1000 iu daily, Celexa 20 mg daily, glipizide 5 mg twice a day, hydrochlorothiazide 25 mg daily, melatonin 1 mg at bedtime, metoprolol 25 mg twice a day, pravastatin 20 mg at bedtime. Prednisone taper is still going on. ProAir. Tylenol 1 tablet every eight hours.   SOCIAL HISTORY: Lives with daughter. No smoking. No alcohol. No drug use. Used to work in [employer] work.   FAMILY HISTORY: Mother died at 2588 of heart disease. Father died at 2556 of an MI.   REVIEW OF  SYSTEMS:  CONSTITUTIONAL: Positive for chills. Positive for sweats. No fever. No weight loss. No weight gain. Positive for weakness.  EYES: She does wear glasses and has problems with her vision in her right eye.  ENT, NOSE, MOUTH AND THROAT: Decreased hearing. Positive for runny nose. Positive for a sore throat.  CARDIOVASCULAR: Positive for chest pain.  RESPIRATORY: Positive for cough, whitish phlegm. Some shortness of breath.  GASTROINTESTINAL: Positive for nausea and vomiting. Positive for lower abdominal pain. Positive for diarrhea. No bright-red blood per rectum. No melena.  GENITOURINARY: Positive for burning on urination. No hematuria.  MUSCULOSKELETAL: Positive for leg pain.  PSYCHIATRIC: Positive for depression.  ENDOCRINE: No thyroid problems.  HEMATOLOGIC AND LYMPHATIC: No anemia.   PHYSICAL EXAMINATION:  VITAL SIGNS: Temperature 98.1, pulse 73, respirations 20, blood pressure 177/95, pulse oximetry 97% on room air.  GENERAL: No respiratory distress, lying flat in bed.  EYES: Conjunctivae and lids normal. Pupils equal, round, and reactive to light. Extraocular muscles intact. No nystagmus.  EARS, NOSE MOUTH AND THROAT:  Tympanic membranes: No erythema. Nasal mucosa:  No erythema. Throat: No erythema. No exudate seen. Lips and gums: No lesions.  NECK: No JVD. No bruits. No lymphadenopathy. No thyromegaly. No thyroid nodules palpated.  RESPIRATORY: Lungs clear to auscultation. No use of accessory muscles to breathe. No rhonchi, rales, or wheeze heard.  CARDIOVASCULAR: S1, S2 normal. No gallops, rubs or murmurs heard. Carotid upstroke 2+ bilaterally. No bruits.  EXTREMITIES: Dorsalis pedis pulses 2+  bilaterally. Trace edema of the lower extremity.  ABDOMEN: Soft. Positive tenderness in the lower abdomen. No organomegaly. No splenomegaly. Normoactive bowel sounds.  LYMPHATIC: No lymph nodes in the neck.  MUSCULOSKELETAL: With 1+ edema. No clubbing. No cyanosis.  SKIN: Multiple  bruises on the legs, scabs on the lower extremities, and skin tears.  NEUROLOGIC: Cranial nerves II through XII grossly intact. Deep tendon reflexes 2+ bilateral lower extremities. The patient is able to straight-leg raise bilaterally. Daughter states left corner of the mouth was drooping a little bit and she did have some slurred speech at one point.  PSYCHIATRIC: The patient is alert and oriented to person and place. Some answers of questions were strange. The patient had confusion. Her difficulty hearing does not help the situation.   LABORATORY AND RADIOLOGICAL DATA: White blood cell count 19.5, H and H 14.1 and 41.2, platelet count of 224. Glucose 511, BUN 28, creatinine 1.14, sodium 127, potassium 4.2, chloride 93, CO2 of 28, calcium 9.6, total bilirubin 1.2, alkaline phosphatase 105, ALT 25, AST 10, albumin 3.3, anion gap 6. GFR 44. Troponin negative. Urinalysis: Positive leukocyte esterase and bacteria. Lactic acid 1.8.   EKG: Sinus rhythm, 77 beats per minute, premature atrial complex, left anterior fascicular block.   ASSESSMENT AND PLAN:  1.  Acute encephalopathy. 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 I will stop the prednisone taper since the lungs are clear and treat urinary tract infection with Rocephin. Will continue to monitor the patient's clinical course. I will hold off on imaging of the brain at this point, but I will keep in mind that the daughter stated that the left corner of the mouth was drooping and she did have some slurred speech. We could see that also with the urinary tract infection.  2.  Uncontrolled diabetes, likely from steroids. Will give a dose of IV insulin. Put on sliding scale. Continue glipizide. Last hemoglobin A1c is 9.1. Likely she will need more medications than just the glipizide.  3.  Urinary tract infection. Will put on IV Rocephin. Get a urine culture.  4.  Leukocytosis. Could either be from the steroids or from  urinary tract infection. We will continue to monitor.  5.  Hyponatremia, likely from the uncontrolled diabetes. As the sugars come down, the sodium will rise.  6.  Recent admission for reactive airway disease. Stop the prednisone taper since the patient has a steroid psychosis and the lungs are clear. Also stop Symbicort at this time. No history of smoking.  7.  History of diastolic congestive heart failure. No signs of heart failure at this point. I am going to give a liter of IV fluids.  8.  Hyperlipidemia. Continue pravastatin.  9.  Depression. Continue Celexa.  10.  Hypertension. The patient is on hydrochlorothiazide. I will hold this at this point with the sodium being low.  11. Acute renal failure. Creatinine was 0.8 on the last admission. Now it is 1.14. I will give gentle IV fluid hydration for 1 liter and then stop.  12. Lower abdominal pain, nausea, vomiting and some diarrhea. I will send off a stool for Clostridium difficile since the patient recently had Levaquin. I will put the patient on Protonix for right now.   TIME SPENT ON ADMISSION: 55 minutes.   CODE STATUS: DNR.    ____________________________ Herschell Dimes. Renae Gloss, MD rjw:np D: 09/13/2012 20:51:00 ET T: 09/13/2012 21:22:19 ET JOB#: [id number]  cc: Herschell Dimes. Renae Gloss, MD, <Dictator> Herschell Dimes  Renae Gloss MD ELECTRONICALLY SIGNED 09/28/2012 16:45

## 2014-06-06 NOTE — Consult Note (Signed)
PATIENT NAME:  Ashley, [name] MR#:  [id number] DATE OF BIRTH:  [birth year]  DATE OF CONSULTATION:  09/19/2012  CONSULTING PHYSICIAN:  Payton Emerald, NP  ATTENDING: Gladstone Lighter, MD  SURGICAL: Micheline Maze, MD  PRIMARY CARE PHYSICIAN: [facility].   REASON FOR CONSULTATION: Altered mental status, nausea, vomiting.   HISTORY OF PRESENT ILLNESS: [name] is an 81 year old Caucasian female who was admitted under medical service for the complaint of altered mental status. Family members are present during interviewing process at this time, as well as were present when she presented to the Emergency Room for confusion, hallucination and inappropriate behavior. She was living with her daughter at that time. It appears she also lives with her sister at times. She had actually recently been admitted end of July for similar chief complaint of "talking out of her head." Felt that the current mental altered status was related to acute encephalopathy as a result of steroid psychosis. She had been on steroid taper, as she was recently diagnosed with acute bronchitis. Additionally, recently diagnosed with urinary tract infection. During hospitalizations, it had become apparent that she was suffering from chronic nausea, vomiting. She had an abdominal ultrasound that was done on 09/18/2012, which revealed findings suggestive of significant calcification of the wall of the gallbladder concerning for possible malignancy. 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 Pat Patrick to be consulted.   The patient is a poor historian,  very difficult time hearing. Daughter does state that the nausea, vomiting is dating back for several years, worse over the past several months. Abdominal discomfort has been present to mid abdomen. Occasionally she will experience diarrhea. No evidence of hematemesis. No rectal  bleeding. No melena.   Denies any known history of hepatitis, jaundice, pancreatitis, peptic ulcer disease or diverticulitis. Upon presenting to the Emergency Room was found to have a white count elevated at 19,000. Her bilirubin was 1.2. Liver enzymes have remained unremarkable.   In review of laboratory studies, today's date, glucose is elevated at 223. Sodium is low at 131. Chloride is 93. Calcium is low at 8.4. Hepatic panel: Albumin is 2.3. AST is 10. Total protein is 5.6, otherwise within normal limits. White count though is elevated at 17.7, hemoglobin and hematocrit within normal limits. CEA level 1.8 with CA 19-9 less than 1. Flat and upright of abdomen was done on August 5, which revealed evidence of porcelain gallbladder, is noted to correlate for underlying malignancy. Moderately large amount of air is noted in the stomach. Abdominal ultrasound was repeated yesterday's date, again as previously stated suggestive of significant calcification of the wall of the gallbladder, which is concerning for underlying malignancy.   PAST MEDICAL HISTORY: Hypertension, diabetes, hyperlipidemia, vitamin B12 deficiency, history of leukocytosis, recent admission for acute bronchitis and suspected congestive heart failure. Apparently according to family is undergoing evaluation under the care of Dr. Humphrey Rolls.   PAST SURGICAL HISTORY: Hip fracture surgery, ventral hernia repair, C-section, hysterectomy.   ALLERGIES: None.   CURRENT MEDICATIONS: Symbicort 2 puffs twice a day, calcium with vitamin D 1 (I guess tablet here) daily, Celexa 20 mg a day, glipizide 5 mg twice a day, hydrochlorothiazide 25 mg daily, melatonin 1 mg at bedtime, metoprolol 25 mg twice a day, pravastatin 20 mg at bedtime, prednisone taper, ProAir (unknown specific directions) and Tylenol (unknown milligrams) 1 tablet every 8 hours.   SOCIAL  HISTORY: No tobacco. No alcohol use. Resides with her daughter as well as her sister at times.    FAMILY HISTORY: Mother deceased from heart disease. Father deceased at 36 from myocardial infarction. History of leukemia as well as lymphoma. Leukemia involved her son, brother involved with leukemia as well. Niece: Lymphoma. Multiple myeloma involving her sister.   REVIEW OF SYSTEMS: As stated above, but truly unable to adequately assess given patient's orientation status and hearing problems.   PHYSICAL EXAMINATION: VITAL SIGNS: Temperature is 97.2 with a pulse of 74, respirations 20, blood pressure 137/80, pulse oximetry 93% on room air.  GENERAL: Well-developed, well-nourished 80 year old Caucasian female, no acute distress noted. Appears to be resting comfortably in bed.  HEENT: Normocephalic, atraumatic. Pupils equal, reactive to light. Conjunctivae clear. Sclerae anicteric.  NECK: Supple. Trachea midline. No lymphadenopathy or thyromegaly.  PULMONARY: Symmetric rise and fall of chest. Clear to auscultation throughout. No adventitious sounds.  CARDIOVASCULAR: Regular rhythm, S1, S2. No murmurs, no gallops.  ABDOMEN: Large, soft, nondistended. Bowel sounds in 4 quadrants. Mild discomfort noted to left upper quadrant as well as right lower quadrant. No evidence of hepatosplenomegaly.  RECTAL: Deferred.  MUSCULOSKELETAL: Gait not assessed.  EXTREMITIES: No edema.  SKIN: Warm, dry. No lesions, no masses, but evidence of healing area to right lower extremity.  PSYCHIATRIC: Alert, oriented to name. Again, evidence of extreme difficulty being hard of hearing.  NEUROLOGICAL: No gross  neurological deficits noted.   LABORATORY, DIAGNOSTIC AND RADIOLOGICAL DATA: Results all reviewed by myself, laboratory as well as imaging studies, as well as results as noted above.   IMPRESSION:  1.  Persistent nausea and vomiting.  2.  Abnormal CT scan as well as ultrasound findings for porcelain gallbladder, concern for malignancy.  3.  History of high blood pressure. 4.  History of diabetes. 5.  Recent  admission for reactive airway disease, diagnosed with acute bronchitis.   PLAN: The patient's presentation was discussed with Dr. Verdie Shire. Will proceed with EGD today to allow direct luminal evaluation of upper GI tract to assess for evidence of correlating underlying etiological cause for persistent nausea and vomiting such as ulcer disease, Helicobacter pylori. Order placed. Was discussed with patient as well as family members who are present. N.p.o. status will be implemented. We will continue to monitor during her hospitalization, inclusive of laboratory studies.   These services provided by Payton Emerald, MS, APRN, [facility], FNP under collaborative agreement with Lupita Dawn. Candace Cruise, M.D.   Thank you for allowing [place] to participate in the care of Adra Shepler during her hospitalization.   ____________________________ Payton Emerald, NP dsh:jm D: 09/19/2012 13:53:00 ET T: 09/19/2012 15:20:48 ET JOB#: [id number]  cc: Payton Emerald, NP, <Dictator> Payton Emerald MD ELECTRONICALLY SIGNED 09/20/2012 7:55

## 2014-06-06 NOTE — Discharge Summary (Signed)
PATIENT NAME:  [name], NEEDS MR#:  [id number] DATE OF BIRTH:  11-22-1925  DATE OF ADMISSION:  09/01/2012 DATE OF DISCHARGE:  09/05/2012  ADMITTING PHYSICIAN:  Auburn Bilberry, MD  DISCHARING PHYSICIAN:  Enid Baas, MD  PRIMARY CARDIOLOGIST: Dr. Adrian Blackwater   PRIMARY CARE PHYSICIAN:  Dr. Tama High at [facility].   CONSULTATIONS IN THE HOSPITAL: Cardiology consultation by Dr. Adrian Blackwater.   DISCHARGE DIAGNOSES: 1.  Acute respiratory distress secondary to chronic obstructive pulmonary disease exacerbation/reactive airway disease.  2.  Acute bronchitis.  3.  Possible undiagnosed chronic obstructive pulmonary disease.  4.  Hypertension.  5.  Diabetes mellitus.  6.  Hyperlipidemia.  7.  Diastolic congestive heart failure.  8.  Prior hip fracture and surgery, last being right femoral fracture fixed in August 2013.   9.  B12 deficiency.  10.  Depression.    DISCHARGE MEDICATIONS: 1.  Ranitidine 150 mg p.o. b.i.d.  2.  Pravastatin 20 mg p.o. daily.  3.  Calcium/vitamin D 600 mg/400 international units 1 tablet p.o. daily.  4.  Melatonin 1 mg p.o. daily.  5.  Celexa 20 mg p.o. daily.  6.  HCTZ 25 mg p.o. daily.  7.  ProAir inhaler 2 puffs 4 times a day.  8.  Prednisone taper over 12 days.  9.  Tylenol 650 mg p.o.  8 hours p.r.n. for pain or fever.  10.  Glipizide 5 mg p.o. b.i.d.  11.  Metoprolol 25 mg p.o. b.i.d.  12.  Tussionex 5 mL p.o. q. 12 hours p.r.n. for cough.  13.  Guaifenesin 600 mg p.o. b.i.d.  14.  Symbicort 80/4.5 mcg 2 puffs b.i.d.  15.  Levaquin 500 mg p.o. daily for 4 days.   DISCHARGE OXYGEN: None.   HOME HEALTH: Physical therapy and nurse.   DISCHARGE DIET: Low-sodium and ADA 1800 diet.   DISCHARGE ACTIVITY: As tolerated.    FOLLOWUP INSTRUCTIONS: 1.  PCP follow-up in two weeks.  2.  Pulmonary follow-up in 1 to 2 weeks for PFTs.  3.  Home health physical therapy and nursing.  4.  Patient needs hospital bed and bedside commode.  5.   Patient advised to take medications as directed at discharge as her home medications are being changed at this time until she sees her primary care physician.   LABS AND IMAGING STUDIES PRIOR TO DISCHARGE: Hemoglobin A1c is elevated at 9.1, WBC 11.5, hemoglobin 11.5, hematocrit 34.3, platelet count 271.   Sodium 129, potassium 4.8, chloride 96, bicarbonate 29, BUN 14, creatinine 0.84 glucose 253, calcium 9.0.   CT of the chest with contrast showing no evidence of PE and there is a calcified gallbladder as can be seen with porcelain gallbladder. This has been previously present as well. Chest x-ray showing no acute disease of the chest.    LFTs: ALT 10, AST 10, alkaline phosphatase 100, total bilirubin 0.5 and albumin of 3.1.   BRIEF HOSPITAL COURSE: Ms. [name] is an 81 year old Caucasian female with past medical history significant for depression secondary to death of the family members last year and right hip fracture with poor recovery since then, history of hypertension, diabetes and  diastolic congestive heart failure, who follows with Dr. Adrian Blackwater as an outpatient, comes to the hospital secondary to worsening dyspnea on exertion.  1.  Acute respiratory distress secondary to possible reactive airway disease or underlying chronic obstructive pulmonary disease exacerbation. According to patient's daughter, the patient has had wheezing for almost two years and has never  seen a pulmonary doctor or had PFTs done as an outpatient. She definitely has some obstructive airway disease. Her breathing improved with IV steroids while in the hospital along with nebulizer treatments and nebulizer treatments and also inhalers. She also had underlying acute bronchitis, which was treated with Levaquin. CT of the chest did not reveal any pulmonary embolus, so she is being discharged on prednisone taper, Levaquin inhalers and pulmonary follow-up in the next 1 to 2 weeks for PFTs  and for her chronic obstructive  pulmonary disease. She is not requiring any oxygen at the time of discharge, so is not being discharged on home oxygen.  2.  Hypertension. Home medications of HCTZ was continued and metoprolol was added. Her ACE inhibitor was held because there was a questionable link of that ACE inhibitor and the start of her cough. That can be restarted by her primary care physician as an outpatient if needed at a lower dose for her diabetic proteinuria.  3.  Diabetes mellitus. The patient's glipizide dose was increased because her sugars were elevated in the hospital also partly sugar elevation is secondary to the steroids that she was on. HbA1c came back elevated and again her medications can be further adjusted as an outpatient.  4.  Diastolic congestive heart failure with a recent echo done last week at Dr. Milta DeitersKhan's office. Based on their notes, the patient's, ejection fraction is preserved. She has left ventricular hypertrophy and impaired relaxation. Her congestive heart failure was well compensated at the time of discharge. She is not on any diuretics at this time. She is going to follow up with Dr. Welton FlakesKhan as an outpatient for outpatient stress test.  5.  Her course has been otherwise uneventful in the hospital. The patient worked with physical therapy, who has recommended home PT and nursing.    DISCHARGE CONDITION: Stable.   DISCHARGE DISPOSITION: Home with home health, physical therapy and nursing.   TIME SPENT ON DISCHARGE: 45 minutes.    ____________________________ Enid Baasadhika Marilouise Densmore, MD rk:cc D: 09/05/2012 14:07:21 ET T: 09/05/2012 16:33:41 ET JOB#: [id number]  cc: Enid Baasadhika Loistine Eberlin, MD, <Dictator> Dr. Ned ClinesHerbon Fleming Laurier NancyShaukat A. Khan, MD Enid BaasADHIKA Timiko Offutt MD ELECTRONICALLY SIGNED 09/17/2012 15:07

## 2014-06-06 NOTE — Discharge Summary (Signed)
PATIENT NAME:  [name], TURBIN MR#:  [id number] DATE OF BIRTH:  [birth year]  DATE OF ADMISSION:  09/13/2012 DATE OF DISCHARGE:  09/23/2012  ADDENDUM:  This is an addendum to earlier discharge summary dictated on 09/19/2012, by Dr. Enid Baas.    FINAL DISCHARGE DIAGNOSES 1.  Intractable nausea and vomiting, porcelain gallbladder, need followup in surgery clinic.  2.  Peptic ulcer disease, nonbleeding ulcers, follow up in gastroenterology clinic.  3.  Altered mental status, metabolic and toxicosis; resolved.  4.  Urinary tract infection, coagulase-negative staph.  5.  Sore throat, pharyngitis.  6.  Diabetes.  7.  Hypertension.  8.  Hyponatremia.  9.  Acute renal failure; resolved.  10.  History of diastolic heart failure; well compensated.   CONDITION ON DISCHARGE: Stable.   CODE STATUS: DO NOT RESUSCITATE.   MEDICATIONS ON DISCHARGE 1.  Pravastatin 20 mg once a day.  2.  Calcium and vitamin D tablet once a day.  3.  Melatonin 1 mg once a day.  4.  Citalopram 20 mg once a day.  5.  Hydrochlorothiazide 25 mg once a day.  6.  ProAir 2 puffs 4 times a day.  7.  Tylenol 650 every 8 hours as needed for pain or fever.  8.  Glipizide 5 mg 2 times a day.  9.  Metoprolol tartrate 25 mg 2 times a day.  10.  Budesonide 2 puffs inhalation 2 times a day.  11.  Ondansetron 4 mg every 8 hours for 5 days as needed for chest pain, nausea and vomiting.  12.  Docusate and senna 2 tablets once a day as needed for constipation.  13.  Sucralfate 10 mL 4 times a day before meals.    14.  Pantoprazole 40 mg 2 times a day.  15.  Sulfamethoxazole/trimethoprim 1 tablet every 12 hours for 3 days.  16.  Glucerna shakes 240 mL 2 times a day.   DIET ON DISCHARGE: Carbohydrate-controlled, ADA diet. Diet supplement Glucerna and advised to have 2 times a day.   TIME FRAME TO FOLLOW UP: Within 1 to 2 weeks with surgical and GI clinics.   HOSPITAL COURSE AND STAY: Please see history and physical on  09/13/2012 done by Dr. Alford Highland and interim discharge summary done by Dr. Nemiah Commander on 09/19/2012; attach a copy with this. Hospital course after August 6: The patient was slowly started on diet, which was liquid, and slowly upgraded to solid diet and she started tolerating it. She was taking almost 40% to 50% portion of her diet, but she had some complaint of sore throat and throat pain; so, we started her on Bactrim, as there was some evidence of coagulase-negative staph in the urine also; so, Bactrim was started and the patient is being discharged with finishing 3 more days of oral Bactrim course.  Her renal function improved, as mentioned above.   OTHER MEDICAL ISSUES ADDRESSED IN THIS HOSPITAL COURSE 1.  Porcelain gallbladder and peptic ulcer disease. She is being discharged for treatment for peptic ulcer disease and needs to follow with GI and surgical clinics, as advised by Dr. Michela Pitcher and Dr. Lutricia Feil.  2.  Diabetes. Sugar level was improving and going slightly higher as she started diet, so we restarted glipizide and came under control.  3.  Hyponatremia and acute renal failure, resolved.   4.  Blood pressure came under control with metoprolol.  5.  Diastolic congestive heart failure, which was chronic and remained under control with medication.  TOTAL TIME SPENT ON INTERIM DISCHARGE SUMMARY: 35 minutes.   ____________________________ Hope PigeonVaibhavkumar G. Elisabeth PigeonVachhani, MD vgv:cs D: 09/23/2012 12:47:00 ET T: 09/23/2012 16:21:01 ET JOB#: [id number]  cc: Carmie Endalph L. Ely III, MD Ezzard StandingPaul Y. Bluford Kaufmannh, MD Hope PigeonVaibhavkumar G. Elisabeth PigeonVachhani, MD, <Dictator>   Altamese DillingVAIBHAVKUMAR Renise Gillies MD ELECTRONICALLY SIGNED 10/02/2012 11:20

## 2014-06-06 NOTE — Consult Note (Signed)
PATIENT NAME:  [name], [name] MR#:  [id number] DATE OF BIRTH:  05-27-1925  DATE OF CONSULTATION:  09/18/2012  REFERRING PHYSICIAN:   CONSULTING PHYSICIAN:  Carmie End, MD  PRIMARY CARE PHYSICIAN: [facility].   ADMITTING PHYSICIAN: [facility].   CHIEF COMPLAINT: Altered mental status, nausea and vomiting.   BRIEF HISTORY: Ms. Panagiota Perfetti is an 81 year old woman admitted to the medical service with altered mental status. Her family noted she was confused, hallucinating and inappropriate. She lives with her daughter who recommended hospital evaluation. She was admitted to the hospital as she was felt to be suffering significant mental status changes. 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 She had been on a steroid taper for some reactive airway disease. She had had a urinary tract infection in addition, and it was felt that the combination of problems had contributed to her altered mental status. While hospitalized, it has become apparent that she has significant chronic nausea and vomiting. In talking with the daughter, who says the patient is a very poor historian, she has been having nausea and vomiting for quite a while dating back several months. She was evaluated in the Emergency Room in April with a CT scan which demonstrated no significant abdominal findings other than a calcified gallbladder suggestive of possible "porcelain" gallbladder versus early malignancy. No further workup was undertaken at that time as her symptoms improved. However, over the last several weeks, she has been vomiting more significantly and since hospitalization she has been unable to keep anything on her stomach, getting antiemetics as often as can be allowed. The surgical service was consulted after ultrasound demonstrated calcification of gallbladder wall suggestive again of porcelain gallbladder versus gallbladder malignancy.   She denies a history of hepatitis,  yellow jaundice, pancreatitis, peptic ulcer disease or diverticulitis. She has been told in the past that she has gallstones. She has never any significant symptoms so has not undergone any previous intervention. At the time of admission to the hospital, she had a white blood cell count of 19,000. Her bilirubin was 1.2. Liver function studies were otherwise largely unremarkable.   She has a history of previous C-sections and hernia repair. She has had a hip surgery for a fracture. Her medical history is significant for diabetes, hypertension and congestive heart failure. She did have a recent admission for chronic obstructive pulmonary disease exacerbation.   ALLERGIES: She has no medical allergies.   MEDICATIONS: Listed in her medical note.   REVIEW OF SYSTEMS: Not possible with the patient's disorientation and hearing problems.   PHYSICAL EXAMINATION:  GENERAL: She is lying in bed. Vomited several times during my interview and examination. She denies any pain.  VITAL SIGNS: She is afebrile. Blood pressure is 140/80. Heart rate is 82 and regular. Oxygen saturation is satisfactory.   HEENT: Very pale, washed out woman with no scleral icterus. No pupillary abnormalities.  NECK: Supple, nontender with midline trachea. No adenopathy.  CHEST: Clear but she has very distant breath sounds. She has normal pulmonary excursion.  CARDIAC: No murmurs or gallops to my ear, and she seems to be in normal sinus rhythm.  ABDOMEN: Distended but generally soft. She has some tenderness in the midepigastric area, but I cannot palpate her gallbladder or her liver edge. She has no rebound, no guarding. No masses noted.  EXTREMITIES: Lower extremity exam reveals full range of motion, mild edema. No deformities.  PSYCHIATRIC: Some confusion and mild disorientation.  I independently reviewed her CT scan from the previous ED admission. 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 I suspect that many of the current  symptoms can be related to biliary tract disease. However, I would recommend GI consultation and possible EGD to rule out any acute GI pathology. At [id number], we would certainly offer her surgery for her gallbladder disease if she is deemed an adequate risk by the medical service. We will continue to follow her while she is hospitalized.    ____________________________ Carmie Endalph L. Ely III, MD rle:gb D: 09/18/2012 16:58:21 ET T: 09/18/2012 22:41:37 ET JOB#: [id number]  cc: Carmie Endalph L. Ely III, MD, <Dictator> Dr. Tama HighAnthony Capps, [facility] Quentin OreALPH L ELY MD ELECTRONICALLY SIGNED 09/21/2012 16:57

## 2014-06-06 NOTE — Consult Note (Signed)
Brief Consult Note: Diagnosis: Persistent nausea and vomiting.  Abnormal GI x-ray.  Finding of porcelain gallbladder. Concern for malginancy.  Mental Status changes.  Hypertension.  Diabetes Mellitus.   Consult note dictated.   Orders entered.   Discussed with Attending MD.   Comments: Patient's presentation was discussed with Dr. Lutricia FeilPaul Oh.  Recommendation is to proceed with EGD to allow direct luminal evaluation today.  Order placed.  NPO status.  Will continue to monitor her symptoms as well as laboratory studies.  Continue PPI therapy.  Electronic Signatures: Rodman KeyHarrison, Dawn S (NP)  (Signed 06-Aug-14 13:57)  Authored: Brief Consult Note   Last Updated: 06-Aug-14 13:57 by Rodman KeyHarrison, Dawn S (NP)

## 2014-06-06 NOTE — Consult Note (Signed)
Brief Consult Note: Diagnosis: DOE.   Patient was seen by consultant.   Consult note dictated.   Comments: Patient with increased SOB, DOE, cough, but denies CP/chest pressure. Echo done last Friday in our office and results pending, however echo from 2008 reported mild pulmonary HTN, diastolic dysfx, normal LVEF, mild PI and TR. BNP negative, but pt c/o pedal edema, productive cough, DOE. Will obtain echo report, continue supportive care, consider adding diuretic therapy depending on echo results. Patient was also scheduled for oupatient stress test, which was not completed.  Electronic Signatures for Addendum Section:  Verta EllenManzi, Antoninette Lerner A (PA-C) (Signed Addendum 21-Jul-14 13:04)  Echo from7/17/14=technically difficult study, normal LVEF 64%, Grade 1 diastolic dysfx, severe pulmonary HTN, moderate mitral valve calcification, moderate TR, fibrocalcified moderator band in RV   Electronic Signatures: Radene KneeKhan, Shaukat Ali (MD)  (Signed 21-Jul-14 09:08)  Co-Signer: Brief Consult Note Mackenna Kamer A (PA-C)  (Signed 21-Jul-14 08:31)  Authored: Brief Consult Note   Last Updated: 21-Jul-14 13:04 by Maia PlanManzi, Xin Klawitter A (PA-C)

## 2014-06-06 NOTE — Consult Note (Signed)
PATIENT NAME:  [name], [name] MR#:  [id number] DATE OF BIRTH:  [birth year]  DATE OF CONSULTATION:  09/03/2012  REFERRING PHYSICIAN:  Dr. Posey Pronto CONSULTING PHYSICIAN:  Merla Riches, PA-C  PRIMARY CARE PHYSICIAN:  [facility]  REASON FOR CONSULTATION: Shortness of breath/dyspnea on exertion.   HISTORY OF PRESENT ILLNESS: Ms. [name] is an 81 year old white female who is extremely hard of hearing. She has a past medical history significant for hypertension, diabetes mellitus type 2, hyperlipidemia and vitamin B12 deficiency. The patient has been having increased shortness of breath for the past few months with dyspnea on exertion, coughing and wheezing. The patient denied any orthopnea and usually uses 1 to 2 pillows in the evening.  She has had some lower extremity edema, which has recently improved. She denies any chest pain, chest pressure, heaviness or squeezing. She does have dizziness and has had multiple falls recently.   The patient was seen in our office for consultation within the last few weeks. She had a cardiac stress test ordered, which she rescheduled and was not completed. Her echocardiogram done in our office at the end of last week is currently pending. Review of her echocardiogram from 12/12/2006 revealed mild pulmonary hypertension, normal chamber size, normal left ventricular ejection fraction, diastolic dysfunction, normal wall motion, mild pulmonic insufficiency and mild tricuspid regurgitation.   PAST MEDICAL HISTORY: 1.  Hypertension.  2.  Diabetes mellitus type 2.  3.  Hyperlipidemia.  4.  Vitamin B12 deficiency; the patient is on B12 shots monthly.  5.  Leukocytosis in the past, has been followed by Dr. Grayland Ormond.  6.  Pelvic fracture status post fall, left hip fracture in 2009.  7.  Right femoral fracture with fixation, August 2013.  8.  C section x 3.  9.  Hysterectomy.   ALLERGIES: No known drug allergies.   HOME MEDICATIONS: 1.  Calcium with vitamin D 1  tab p.o. b.i.d.  2.  Z-Pak (placed on by her primary care physician last week).  3.  Citalopram 20 mg 1 tablet p.o. daily.  4.  Glipizide 2.5 mg 1 tablet p.o. b.i.d.  5.  Hydrochlorothiazide 25 mg p.o. daily.  6.  Melatonin 1 mg p.o. at bedtime.  7.  Pravastatin 20 mg p.o. at bedtime.  8.  ProAir inhaler 2 puffs inhaled 4 times daily.  9.  Q-Tussin DM 5 mL every 4 hours as needed.  10.  Quinapril 20 mg p.o. daily.  11.  Ranitidine 150 mg 1 tablet p.o. b.i.d.  12.  Tylenol 650 mg every 8 hours as needed for pain.   SOCIAL HISTORY: The patient denies smoking, alcohol or illicit drug use. She lives with her daughter.   FAMILY HISTORY: Mother and sister have history of coronary artery disease. Leukemia in the family, sister died of multiple myeloma.   REVIEW OF SYSTEMS: CONSTITUTIONAL: The patient denies any fevers or fatigue.  EYES: The patient denies any blurred or double vision.  EARS, NOSE AND THROAT: The patient has significant hearing loss, denies tinnitus.  RESPIRATORY: The patient complained of cough, wheezing.  CARDIOVASCULAR: The patient denies any chest pain, chest pressure, heaviness.  GASTROINTESTINAL: The patient denies any abdominal pain, nausea, vomiting, bowel irregularities.   PHYSICAL EXAMINATION: GENERAL: This is a pleasant elderly female who is not in any acute distress. She is alert and oriented x 3.  VITAL SIGNS: Temperature 97.7 degrees Fahrenheit, heart rate 69, respiratory rate 18, blood pressure 156/53, O2 sat 93% on room air.  HEENT:  Head: Atraumatic, normocephalic. Eyes:  Pupils are round and equal. There is no scleral icterus. Conjunctivae are pale, pink. Ears and nose are normal to external inspection. Mouth:  Moist mucous membranes.  NECK: Supple. Trachea is midline. There is no JVD. There are no carotid bruits appreciated.  LUNGS:  The patient has bilateral wheezing throughout the lungs. No accessory muscle use.  HEART:  Regular rate and rhythm. No murmurs,  rubs or gallops appreciated.  ABDOMEN: Nondistended. It is soft. She has some mild lower abdominal tenderness; however, she notes she has to use the restroom.  EXTREMITIES: The patient has trace pedal edema bilaterally; however, she has SCDs on and difficult to assess.  ANCILLARY DATA: Chest x-ray:  No acute disease of the chest. CT of chest: Negative for pulmonary embolus, peripheral calcified gallbladder.   LABORATORY DATA: Glucose 253. BNP 249. BUN 14, creatinine 0.84, sodium 129, potassium 4.8, chloride 96. Estimated GFR is greater than 60. Total protein 6.6, albumin 3.1, total bilirubin 0.5, direct bilirubin 0.1, alkaline phosphatase 100, AST 10, ALT 10. Troponin I is less than 0.02. White blood cell count 11.5, hemoglobin 11.5, hematocrit 34.3, platelet count 271,000. D-dimer 1.83.  ASSESSMENT AND PLAN: 1. Shortness of breath with increased dyspnea on exertion. The patient's symptoms could be from pulmonary etiology. The patient is currently receiving nebulized steroids and antibiotics. Her last echocardiogram done in 2008 did show some diastolic dysfunction, however, her BNP is normal at this time. We will need to obtain her echocardiogram results as she had echocardiogram performed the end of last week. Further recommendations will be made after reviewing that exam.  The patient did not have any chest pain, chest pressure, and troponin I x 1 has been negative thus far. Would recommend outpatient stress testing. 2.  Diabetes mellitus. The patient is on sliding scale insulin and glipizide. 3. Hyponatremia. Likely secondary to hydrochlorothiazide and dehydration. Sodium is continuing to be monitored.  4.  Hypertension.  Will continue to monitor her blood pressure.   5.  Hyperlipidemia. The patient is on pravastatin.   Thank you very much for this consultation and allowing [place] to participate in this patient's care. We will continue to follow this patient with  you. ____________________________ Merla Riches, PA-C mam:sb D: 09/03/2012 08:40:05 ET T: 09/03/2012 09:19:56 ET JOB#: [id number]  cc: Merla Riches, PA-C, <Dictator> [place] PA ELECTRONICALLY SIGNED 09/05/2012 13:19

## 2014-06-06 NOTE — H&P (Signed)
PATIENT NAME:  [name], [name] MR#:  [id number] DATE OF BIRTH:  1925-08-22  DATE OF ADMISSION:  09/01/2012  PRIMARY CARE PROVIDER: [facility].  REFERRING PHYSICIAN: Dr. Renard Hamper.  CHIEF COMPLAINT: Shortness of breath, coughing, wheezing.   HISTORY OF PRESENT ILLNESS: The patient is an 81 year old Caucasian female with history of having hypertension, type 2 diabetes, hyperlipidemia, vitamin B12 deficiency, who has been having shortness of breath for the past few months, who over the past week has worsening of this shortness of breath with associated dry cough as well as wheezing. She was seen by her primary care providers on Monday and was started on a Z-Pak and was started on a Ventolin inhaler. The patient's symptoms continued to progressively get worse. She continued to have significant wheezing, and she started to have spells with her coughing where at nighttime especially she would start throwing up as she would cough so hard. She has had occasional chest pains as well. She has no history or diagnosis of asthma or COPD. She has not had any fevers or chills. She was received recently seen by her cardiologist, Dr. Humphrey Rolls, and had an echocardiogram done. Results are not available. She also has a stress test scheduled in the near future. She otherwise denies any abdominal pain, nausea, vomiting or diarrhea.   PAST MEDICAL HISTORY: 1.  History of hypertension.  2.  Type 2 diabetes.  3.  History of hyperlipidemia.  4.  History of vitamin B12 deficiency, is on B12 shots monthly.  5.  History of leukocytosis in the past, but her white blood cell count today is 10, has been seen by Dr. Grayland Ormond for that in the past.  6.  History of pelvic fracture status post fall, history of left hip fracture in 2009.  7.  History of right femoral fracture with fixation in August 2013.   ALLERGIES: None.   CURRENT MEDICATIONS: At home she is on calcium with vitamin D 1 tab p.o. b.i.d., Z-Pak,  citalopram 20 daily,  glipizide 2.5 mg 1 tab p.o. b.i.d., hydrochlorothiazide 25 p.o. daily, melatonin 1 mg at bedtime, pravastatin 20 at bedtime, ProAir 2 puffs 4 times a day, Q-Tussin DM 5 mL q.4 p.r.n., quinapril 20 daily, ranitidine 150 mg 1 tab p.o. b.i.d., Tylenol 650 q.8.   SOCIAL HISTORY: Has never smoked. No alcohol or drug use. Currently resides with her daughter.   FAMILY HISTORY: There is leukemia in the family and sister died of multiple myeloma.   REVIEW OF SYSTEMS:    CONSTITUTIONAL: Complains of no fevers. Complains of fatigue, weakness. No significant pain. No weight loss. No weight gains.  EYES: No blurred or double vision. No redness. No inflammation.  EARS, NOSE, THROAT: Denies any tinnitus. No ear pain. Has significant hearing loss. Wears hearing aid. Denies any epistaxis, nasal discharge or postnasal drip. No difficulty swallowing.  RESPIRATORY: Complains of cough, wheezing. No hemoptysis. No diagnosis of COPD, asthma, TB.  CARDIOVASCULAR: Denies any chest pain, orthopnea. Does complain of edema. Uses 3 pillows at night. No history of arrhythmia. No syncope.  GASTROINTESTINAL: No nausea, vomiting, diarrhea. No abdominal pain. No hematemesis. No melena. No ulcers. No GERD. No IBS.  GENITOURINARY: Denies any dysuria, hematuria, renal calculus or frequency.  ENDOCRINE: Denies any polyuria, nocturia or thyroid problems.  HEMATOLOGIC AND LYMPHATIC: Denies anemia, easy bruisability or bleeding.  SKIN: No acne. No rash. No changes in mole, hair or skin.  MUSCULOSKELETAL: Has pain related to osteoarthritis.  NEUROLOGIC: CVA. No TIA. No seizures.  PSYCHIATRIC: Has some depression. No insomnia. No ADD. No OCD. No bipolar disorder.   PHYSICAL EXAMINATION: VITAL SIGNS: Temperature 98.4, pulse 75, respirations 18, blood pressure 162/74, O2 of 94% on 2 liters.  GENERAL: The patient is an elderly female who is in mild distress due to her coughing episodes.  HEENT: Head atraumatic, normocephalic. Pupils  equally round, reactive to light and accommodation. There is no conjunctival pallor. No scleral icterus. Nose exam shows no known nasal lesions or drainage. Ears: There is no drainage or external lesions. Mouth: There is no exudate.  NECK: Supple and symmetric. No masses. Thyroid midline, nonenlarged. No JVD.  RESPIRATORY: She has bilateral expiratory wheezing throughout the lungs. No crackles or rhonchi. No accessory muscle usage.  CARDIOVASCULAR: Regular rate and rhythm. No murmurs, gallops, clicks or heaves. PMI is not displaced.  ABDOMEN: Soft, nontender, nondistended. Positive bowel sounds x 4. No hepatosplenomegaly.  GENITOURINARY: Deferred.  MUSCULOSKELETAL: There is no erythema or swelling.  SKIN: There is no rash.  LYMPHATICS: No lymph nodes palpable.  VASCULAR: Good DP, PT pulses.  NEUROLOGICAL: Cranial nerves II through XII grossly intact. Strength is 5 out of 5 in all 4 extremities.  PSYCHIATRIC: Not anxious or depressed. Awake, alert.  LABORATORY AND RADIOLOGICAL DATA: Glucose 167. BNP was 249. BUN 10, creatinine 0.86; sodium was 129; potassium was 4.4, chloride 97; CO2 was 27, calcium 8.8. LFTs: Albumin of 3.1, bilirubin total 0.5; AST is 10; ALT is 10. Troponin less than 0.02. WBC 10.7, hemoglobin 12.2, platelet count 267. D-dimer 1.83.   CT scan of the head shows no acute abnormality. CT scan of the chest shows no PE.   ASSESSMENT AND PLAN: The patient is an 81 year old with history of hypertension, diabetes, hyperlipidemia, history of B12 deficiency, has been having shortness of breath for the past few months.  1.  Shortness of breath, cough, wheezing: Possibly due to new onset asthma/chronic obstructive pulmonary disease exacerbation. At this time, we will treat her with nebulizers, steroids and oral Avelox. The patient also could have acute bronchitis as well. Her symptoms could be as well cardiac in nature. The patient had an echocardiogram done last week. We will try to get the  results of this from Dr. Laurelyn Sickle office.  2.  Diabetes: Will place her on sliding scale insulin and place her on glipizide.  3.  Hyponatremia: Likely due to hydrochlorothiazide therapy and dehydration. We will give her normal saline. Continue to monitor her sodium.  4.  Hypertension: We will continue hydrochlorothiazide. We will hold quinapril in light of her cough, although this is not likely angiotensin-converting enzyme inhibitor cough because this is associated with shortness of breath and she has been on the angiotensin-converting enzyme inhibitor for a very prolonged period of time.  5.  Hyperlipidemia: Will continue pravastatin.  6.  History of B12 deficiency: She will need outpatient B12 shots as previously.  7.  Code status: Full.  TIME SPENT: 45 minutes.   ____________________________ Lafonda Mosses Posey Pronto, MD shp:jm D: 09/01/2012 14:15:34 ET T: 09/01/2012 14:43:37 ET JOB#: [id number]  cc: Jetta Murray H. Posey Pronto, MD, <Dictator> Alric Seton MD ELECTRONICALLY SIGNED 09/02/2012 13:12

## 2014-06-11 NOTE — H&P (Signed)
PATIENT NAME:  [name], TOMPSON MR#:  [id number] DATE OF BIRTH:  Aug 12, 1925  DATE OF ADMISSION:  02/16/2014  PRIMARY CARE PHYSICIAN: Dr. Einar Crow over at the long-term care at [facility].   REASON FOR ADMISSION: Sent in for trouble breathing.   HISTORY OF PRESENT ILLNESS: This is an 81 year old female who is a poor historian secondary to hard of hearing. She was sent over for trouble breathing, hallucinating, talking out of her head. As per the daughter who is at the bedside, she has been fighting a cold, not getting any better, had a low-grade fever, some cough and trouble breathing. In the ER, she was having shallow breathing and was placed on BiPAP for respiratory distress. Hospitalist services were contacted for further evaluation.   White count was elevated at 21.3. The patient had a low-grade temperature and was tachycardic. The patient did have a pulse oximetry on the BiPAP at 86% documented.   PAST MEDICAL HISTORY: Diabetes, COPD even though she never smoked, chronic kidney disease, hypertension, hyperlipidemia.   PAST SURGICAL HISTORY: Hernia, C-section, right hip repair for fracture.   ALLERGIES: No known drug allergies.  MEDICATIONS: The list is still being updated at this point.   SOCIAL HISTORY: Is in long-term care at [facility] No smoking. No alcohol. No drug use.   FAMILY HISTORY: Unknown father family history. Mother died at 71 of old age, had diabetes.   REVIEW OF SYSTEMS: GENERAL: Difficult to obtain secondary to hard of hearing and the patient being on the BiPAP. Positive for sweats.  CONSTITUTIONAL: Positive for sweats, low-grade fever, positive for fatigue.  EYES: She does see double. ENT: Decreased hearing. Positive for sore throat.  CARDIOVASCULAR: No chest pain. No palpitations.  RESPIRATORY: Positive for shortness of breath. Positive for cough. No phlegm. No hemoptysis.  GASTROINTESTINAL: Positive for abdominal pain. No nausea. No vomiting. No diarrhea. No  constipation. No bright red blood per rectum. No melena.  GENITOURINARY: No burning on urination or hematuria.  MUSCULOSKELETAL: No joint pain or muscle pain.  INTEGUMENT: No rashes or eruptions.  NEUROLOGIC: No fainting or blackouts.  PSYCHIATRIC: No anxiety or depression.  ENDOCRINE: No thyroid problems.  HEMATOLOGIC AND LYMPHATIC: No anemia. No easy bruising or bleeding.   PHYSICAL EXAMINATION:  VITAL SIGNS: On presentation to the ER included an initial pulse oximetry of 80% on room air. Blood pressure 150/135, respirations 40, pulse 120, temperature 99.4. Most recent vital signs included a temperature of 99, pulse 108, respirations 28, blood pressure 127/55, pulse oximetry 86% on the BiPAP.  GENERAL: Slight respiratory distress, using accessory muscles.  EYES: Conjunctivae and lids normal. Pupils equal, round, and reactive to light. Extraocular muscles intact. No nystagmus.  ENT: Tympanic membranes, no erythema. Nasal mucosa: No erythema. Throat: No erythema. No exudate seen. Lips and gums: No lesions.  NECK: No JVD. No bruits. No lymphadenopathy. No thyromegaly. No thyroid nodules palpated.  RESPIRATORY: Positive use of accessory muscles. Decreased breath sounds bilaterally. Positive rhonchi at the bases.  CARDIOVASCULAR SYSTEM: S1, S2 irregular. A 2/6 systolic ejection murmur. Carotid upstroke 2+ bilaterally. No bruits. Dorsalis pedis pulses 1+ bilaterally, 2+ edema bilateral lower extremities.  ABDOMEN: Soft, nontender. No organomegaly or splenomegaly. Normoactive bowel sounds. No masses felt.  LYMPHATIC: No lymph nodes in the neck.  MUSCULOSKELETAL: No clubbing. No cyanosis on the BiPAP, 2+ edema.  NEUROLOGIC: Cranial nerves II through XII grossly intact. Power 5/5 upper extremities, 4/5 lower extremities. Deep tendon reflexes 1/2+ bilateral lower extremities.  PSYCHIATRIC: The patient is alert,  oriented to person and place.   LABORATORY AND RADIOLOGICAL DATA: ABG showed a pH of 7.38,  pCO2 of 43, pO2 140 that was on 35% on the BiPAP, oxygen saturation 94.2.   Lactic acid 2.8, PT 14.1, INR 1.0.   Chest x-ray read as negative.   BNP 6203, CPK 157. White blood cell count 21.3, hemoglobin 12.3, hematocrit 37.3, platelet count of 322,000. Glucose 383, BUN 28, creatinine 1.18. Sodium 135, potassium 4.2, chloride 98, CO2 of 28, calcium 8.8.   Urinalysis greater than 500 mg/dL of glucose, nitrites and leukocyte esterase are negative.   EKG: Undetermined rhythm, hard to tell if it is sinus or sinus arrhythmia versus atrial fibrillation with the interference with the patient movement. It was 125 beats per minute, low voltage, Q waves septally.   ASSESSMENT AND PLAN: 1. Acute respiratory failure with hypoxia and poor air entry on BiPAP in order to oxygenate. The patient is a DO NOT RESUSCITATE as per prior records at [facility], confirmed again with the daughter. We will admit to the floor.  2. Systemic inflammatory response syndrome with elevated white count, tachypnea, tachycardia, and the patient also had a low-grade fever, unclear source at this time. We will put on Rocephin and Zithromax empirically.  3. Possible chronic obstructive pulmonary disease exacerbation. We will give low dose steroids, nebulizer treatment.  4. Arrhythmia. We will monitor on off-unit telemetry. Get serial cardiac enzymes and continue to monitor.  5. Diabetes, uncontrolled. Sugars will be high on the steroids. We will review the patient's medications and order them while here, and put on sliding scale also.  6. Chronic kidney disease stage III. We will continue to monitor.  7. Hypertension. We will review medications and order while here.  8. Hyperlipidemia. We will review the patient's medications and order.   CODE STATUS: The patient is a DNR.   The patient is critically ill on BiPAP. We will be able to monitor on the floor since the patient is a DNR.   TIME SPENT ON ADMISSION: 55 minutes.     ____________________________ Herschell Dimesichard J. Renae GlossWieting, MD rjw:JT D: 02/16/2014 12:07:49 ET T: 02/16/2014 12:44:16 ET JOB#: [id number]  cc: Herschell Dimesichard J. Renae GlossWieting, MD, <Dictator> Marya AmslerMarshall W. Dareen PianoAnderson, MD Salley ScarletICHARD J Burech Mcfarland MD ELECTRONICALLY SIGNED 02/23/2014 15:14

## 2014-06-15 NOTE — Discharge Summary (Signed)
PATIENT NAME:  Ashley BrochureSHIVELY, Adeola W MR#:  [id number] DATE OF BIRTH:  07-Nov-1925  DATE OF ADMISSION:  02/16/2014 DATE OF DISCHARGE:  02/19/2014  DISCHARGE DIAGNOSES: 1. Chronic obstructive pulmonary disease flare.  2. Hypoxia, not previously on oxygen, qualifying for acute respiratory failure.  3. Dementia.   DISCHARGE MEDICATIONS: Per [facility] medication reconciliation. Please see for details. Also will need a prednisone taper 60 mg a day for 5 days, then 40 mg for 5 days, then 20 mg for 5 days, and then stop, and will need cefuroxime 500 mg b.i.d. for 5 days.   HISTORY AND PHYSICAL: Please see detailed history and physical done on admission.   HOSPITAL COURSE: The patient was admitted with shortness of breath, cough, wheezing, improved slowly on bronchodilators and IV steroids, IV antibiotics with ceftriaxone and Zithromax. Did have a pansensitive Pseudomonas in her urine, unclear if that was relevant. Can be followed up as she is more symptomatic. Blood cultures were negative. She had leukocytosis of 21,000 on admission which did improve, as did a slight increase in her creatinine on admission, as well. White count was 13,600 at discharge. Chest x-ray was clear. She had some hyperglycemia with the steroids. This will be followed and she can have sliding scale insulin, if necessary, while on the prednisone at [facility]. She had a bump in her troponin, which is likely secondary to stress, hypoxia and wheezing, and not a true acute coronary syndrome, so this will not qualify for a true MI. She will be followed up as per her usual routine at [facility] going forward.   Please note it took approximately 35 minutes to do all discharge tasks.    ____________________________ Marya AmslerMarshall W. Dareen PianoAnderson, MD mwa:JT D: 02/19/2014 08:03:01 ET T: 02/19/2014 08:40:59 ET JOB#: [id number]  cc: Marya AmslerMarshall W. Dareen PianoAnderson, MD, <Dictator> Lauro RegulusMARSHALL W Keishawna Carranza MD ELECTRONICALLY SIGNED 02/19/2014 12:15

## 2014-07-13 ENCOUNTER — Other Ambulatory Visit
Admission: RE | Admit: 2014-07-13 | Discharge: 2014-07-13 | Disposition: A | Discharge status: Home / Self Care | Source: Ambulatory Visit | Attending: Internal Medicine | Admitting: Internal Medicine

## 2014-07-13 DIAGNOSIS — R4182 Altered mental status, unspecified: Secondary | ICD-10-CM | POA: Insufficient documentation

## 2014-07-13 DIAGNOSIS — R8290 Unspecified abnormal findings in urine: Secondary | ICD-10-CM | POA: Insufficient documentation

## 2014-07-13 DIAGNOSIS — R410 Disorientation, unspecified: Secondary | ICD-10-CM | POA: Insufficient documentation

## 2014-07-13 LAB — URINALYSIS COMPLETE WITH MICROSCOPIC (ARMC ONLY)
Bacteria, UA: NONE SEEN
Bilirubin Urine: NEGATIVE
Glucose, UA: NEGATIVE mg/dL
Hgb urine dipstick: NEGATIVE
Ketones, ur: NEGATIVE mg/dL
LEUKOCYTES UA: NEGATIVE
Nitrite: NEGATIVE
PROTEIN: NEGATIVE mg/dL
SPECIFIC GRAVITY, URINE: 1.013 (ref 1.005–1.030)
pH: 5 (ref 5.0–8.0)

## 2014-07-14 LAB — URINE CULTURE: Culture: NO GROWTH

## 2014-07-16 ENCOUNTER — Encounter
Admission: RE | Admit: 2014-07-16 | Discharge: 2014-07-16 | Disposition: A | Discharge status: Home / Self Care | Source: Ambulatory Visit | Attending: Internal Medicine | Admitting: Internal Medicine

## 2014-07-16 DIAGNOSIS — R4182 Altered mental status, unspecified: Secondary | ICD-10-CM | POA: Insufficient documentation

## 2014-08-01 DIAGNOSIS — R4182 Altered mental status, unspecified: Secondary | ICD-10-CM | POA: Diagnosis present

## 2014-08-01 LAB — URINALYSIS COMPLETE WITH MICROSCOPIC (ARMC ONLY)
Bilirubin Urine: NEGATIVE
Glucose, UA: NEGATIVE mg/dL
Ketones, ur: NEGATIVE mg/dL
Nitrite: NEGATIVE
Protein, ur: >500 mg/dL — AB
Specific Gravity, Urine: 1.009 (ref 1.005–1.030)
Trans Epithel, UA: 1
pH: 9 — ABNORMAL HIGH (ref 5.0–8.0)

## 2014-08-03 LAB — URINE CULTURE: Culture: >=100000

## 2014-08-15 ENCOUNTER — Encounter
Admission: RE | Admit: 2014-08-15 | Discharge: 2014-08-15 | Disposition: A | Discharge status: Home / Self Care | Payer: Medicare Other | Source: Ambulatory Visit | Attending: Internal Medicine | Admitting: Internal Medicine

## 2014-08-15 DIAGNOSIS — R4182 Altered mental status, unspecified: Secondary | ICD-10-CM | POA: Insufficient documentation

## 2014-09-15 ENCOUNTER — Encounter
Admission: RE | Admit: 2014-09-15 | Discharge: 2014-09-15 | Disposition: A | Discharge status: Home / Self Care | Source: Ambulatory Visit | Attending: Internal Medicine | Admitting: Internal Medicine

## 2014-09-15 DIAGNOSIS — E119 Type 2 diabetes mellitus without complications: Secondary | ICD-10-CM | POA: Insufficient documentation

## 2014-10-01 DIAGNOSIS — E119 Type 2 diabetes mellitus without complications: Secondary | ICD-10-CM | POA: Diagnosis not present

## 2014-10-01 LAB — GLUCOSE, CAPILLARY: GLUCOSE-CAPILLARY: 85 mg/dL (ref 65–99)

## 2014-10-02 DIAGNOSIS — E119 Type 2 diabetes mellitus without complications: Secondary | ICD-10-CM | POA: Diagnosis not present

## 2014-10-02 LAB — GLUCOSE, CAPILLARY
GLUCOSE-CAPILLARY: 69 mg/dL (ref 65–99)
GLUCOSE-CAPILLARY: 62 mg/dL — AB (ref 65–99)
Glucose-Capillary: 178 mg/dL — ABNORMAL HIGH (ref 65–99)

## 2014-10-03 DIAGNOSIS — E119 Type 2 diabetes mellitus without complications: Secondary | ICD-10-CM | POA: Diagnosis not present

## 2014-10-04 LAB — GLUCOSE, CAPILLARY
GLUCOSE-CAPILLARY: 129 mg/dL — AB (ref 65–99)
GLUCOSE-CAPILLARY: 150 mg/dL — AB (ref 65–99)
Glucose-Capillary: 82 mg/dL (ref 65–99)

## 2014-10-05 ENCOUNTER — Other Ambulatory Visit
Admission: RE | Admit: 2014-10-05 | Discharge: 2014-10-05 | Disposition: A | Discharge status: Home / Self Care | Source: Ambulatory Visit | Attending: Internal Medicine | Admitting: Internal Medicine

## 2014-10-05 DIAGNOSIS — E119 Type 2 diabetes mellitus without complications: Secondary | ICD-10-CM | POA: Diagnosis not present

## 2014-10-05 DIAGNOSIS — R300 Dysuria: Secondary | ICD-10-CM | POA: Insufficient documentation

## 2014-10-05 LAB — URINALYSIS COMPLETE WITH MICROSCOPIC (ARMC ONLY)
Bilirubin Urine: NEGATIVE
Glucose, UA: NEGATIVE mg/dL
HGB URINE DIPSTICK: NEGATIVE
KETONES UR: NEGATIVE mg/dL
NITRITE: NEGATIVE
PH: 5 (ref 5.0–8.0)
PROTEIN: NEGATIVE mg/dL
RBC / HPF: NONE SEEN RBC/hpf (ref 0–5)
SPECIFIC GRAVITY, URINE: 1.013 (ref 1.005–1.030)

## 2014-10-05 LAB — GLUCOSE, CAPILLARY
Glucose-Capillary: 79 mg/dL (ref 65–99)
Glucose-Capillary: 49 mg/dL — ABNORMAL LOW (ref 65–99)
Glucose-Capillary: 113 mg/dL — ABNORMAL HIGH (ref 65–99)

## 2014-10-06 DIAGNOSIS — E119 Type 2 diabetes mellitus without complications: Secondary | ICD-10-CM | POA: Diagnosis not present

## 2014-10-06 LAB — GLUCOSE, CAPILLARY
GLUCOSE-CAPILLARY: 168 mg/dL — AB (ref 65–99)
GLUCOSE-CAPILLARY: 237 mg/dL — AB (ref 65–99)
GLUCOSE-CAPILLARY: 127 mg/dL — AB (ref 65–99)

## 2014-10-07 DIAGNOSIS — E119 Type 2 diabetes mellitus without complications: Secondary | ICD-10-CM | POA: Diagnosis not present

## 2014-10-07 LAB — GLUCOSE, CAPILLARY: GLUCOSE-CAPILLARY: 114 mg/dL — AB (ref 65–99)

## 2014-10-08 DIAGNOSIS — E119 Type 2 diabetes mellitus without complications: Secondary | ICD-10-CM | POA: Diagnosis not present

## 2014-10-08 LAB — GLUCOSE, CAPILLARY
Glucose-Capillary: 51 mg/dL — ABNORMAL LOW (ref 65–99)
Glucose-Capillary: 56 mg/dL — ABNORMAL LOW (ref 65–99)
Glucose-Capillary: 111 mg/dL — ABNORMAL HIGH (ref 65–99)

## 2014-10-09 DIAGNOSIS — E119 Type 2 diabetes mellitus without complications: Secondary | ICD-10-CM | POA: Diagnosis not present

## 2014-10-09 LAB — GLUCOSE, CAPILLARY
GLUCOSE-CAPILLARY: 190 mg/dL — AB (ref 65–99)
Glucose-Capillary: 99 mg/dL (ref 65–99)

## 2014-10-09 LAB — URINE CULTURE

## 2014-10-10 DIAGNOSIS — E119 Type 2 diabetes mellitus without complications: Secondary | ICD-10-CM | POA: Diagnosis not present

## 2014-10-10 LAB — GLUCOSE, CAPILLARY
Glucose-Capillary: 106 mg/dL — ABNORMAL HIGH (ref 65–99)
Glucose-Capillary: 209 mg/dL — ABNORMAL HIGH (ref 65–99)

## 2014-10-11 DIAGNOSIS — E119 Type 2 diabetes mellitus without complications: Secondary | ICD-10-CM | POA: Diagnosis not present

## 2014-10-11 LAB — GLUCOSE, CAPILLARY
GLUCOSE-CAPILLARY: 100 mg/dL — AB (ref 65–99)
Glucose-Capillary: 175 mg/dL — ABNORMAL HIGH (ref 65–99)

## 2014-10-12 DIAGNOSIS — E119 Type 2 diabetes mellitus without complications: Secondary | ICD-10-CM | POA: Diagnosis not present

## 2014-10-12 LAB — GLUCOSE, CAPILLARY
GLUCOSE-CAPILLARY: 61 mg/dL — AB (ref 65–99)
Glucose-Capillary: 126 mg/dL — ABNORMAL HIGH (ref 65–99)

## 2014-10-13 DIAGNOSIS — E119 Type 2 diabetes mellitus without complications: Secondary | ICD-10-CM | POA: Diagnosis not present

## 2014-10-14 DIAGNOSIS — E119 Type 2 diabetes mellitus without complications: Secondary | ICD-10-CM | POA: Diagnosis not present

## 2014-10-14 LAB — GLUCOSE, CAPILLARY
GLUCOSE-CAPILLARY: 107 mg/dL — AB (ref 65–99)
GLUCOSE-CAPILLARY: 232 mg/dL — AB (ref 65–99)

## 2014-10-15 LAB — GLUCOSE, CAPILLARY
Glucose-Capillary: 77 mg/dL (ref 65–99)
Glucose-Capillary: 153 mg/dL — ABNORMAL HIGH (ref 65–99)

## 2014-10-16 ENCOUNTER — Encounter
Admission: RE | Admit: 2014-10-16 | Discharge: 2014-10-16 | Disposition: A | Discharge status: Home / Self Care | Source: Ambulatory Visit | Attending: Internal Medicine | Admitting: Internal Medicine

## 2014-10-16 DIAGNOSIS — R4182 Altered mental status, unspecified: Secondary | ICD-10-CM | POA: Insufficient documentation

## 2014-10-16 LAB — URINALYSIS COMPLETE WITH MICROSCOPIC (ARMC ONLY)
Bacteria, UA: NONE SEEN
Bilirubin Urine: NEGATIVE
GLUCOSE, UA: NEGATIVE mg/dL
HGB URINE DIPSTICK: NEGATIVE
KETONES UR: NEGATIVE mg/dL
NITRITE: NEGATIVE
Protein, ur: NEGATIVE mg/dL
SPECIFIC GRAVITY, URINE: 1.016 (ref 1.005–1.030)
pH: 5 (ref 5.0–8.0)

## 2014-10-16 LAB — GLUCOSE, CAPILLARY: Glucose-Capillary: 120 mg/dL — ABNORMAL HIGH (ref 65–99)

## 2014-10-17 DIAGNOSIS — R4182 Altered mental status, unspecified: Secondary | ICD-10-CM | POA: Diagnosis not present

## 2014-10-17 LAB — GLUCOSE, CAPILLARY: GLUCOSE-CAPILLARY: 48 mg/dL — AB (ref 65–99)

## 2014-10-18 DIAGNOSIS — R4182 Altered mental status, unspecified: Secondary | ICD-10-CM | POA: Diagnosis not present

## 2014-10-18 LAB — GLUCOSE, CAPILLARY: GLUCOSE-CAPILLARY: 165 mg/dL — AB (ref 65–99)

## 2014-10-18 LAB — URINE CULTURE

## 2014-10-19 LAB — GLUCOSE, CAPILLARY
GLUCOSE-CAPILLARY: 69 mg/dL (ref 65–99)
GLUCOSE-CAPILLARY: 99 mg/dL (ref 65–99)
GLUCOSE-CAPILLARY: 304 mg/dL — AB (ref 65–99)
Glucose-Capillary: 294 mg/dL — ABNORMAL HIGH (ref 65–99)

## 2014-10-20 DIAGNOSIS — R4182 Altered mental status, unspecified: Secondary | ICD-10-CM | POA: Diagnosis not present

## 2014-10-20 LAB — GLUCOSE, CAPILLARY
GLUCOSE-CAPILLARY: 91 mg/dL (ref 65–99)
Glucose-Capillary: 267 mg/dL — ABNORMAL HIGH (ref 65–99)

## 2014-10-21 DIAGNOSIS — R4182 Altered mental status, unspecified: Secondary | ICD-10-CM | POA: Diagnosis not present

## 2014-10-21 LAB — GLUCOSE, CAPILLARY
Glucose-Capillary: 45 mg/dL — ABNORMAL LOW (ref 65–99)
Glucose-Capillary: 79 mg/dL (ref 65–99)
Glucose-Capillary: 261 mg/dL — ABNORMAL HIGH (ref 65–99)

## 2014-10-22 DIAGNOSIS — R4182 Altered mental status, unspecified: Secondary | ICD-10-CM | POA: Diagnosis not present

## 2014-10-22 LAB — GLUCOSE, CAPILLARY: Glucose-Capillary: 88 mg/dL (ref 65–99)

## 2014-10-23 DIAGNOSIS — R4182 Altered mental status, unspecified: Secondary | ICD-10-CM | POA: Diagnosis not present

## 2014-10-23 LAB — GLUCOSE, CAPILLARY: Glucose-Capillary: 108 mg/dL — ABNORMAL HIGH (ref 65–99)

## 2014-10-24 DIAGNOSIS — R4182 Altered mental status, unspecified: Secondary | ICD-10-CM | POA: Diagnosis not present

## 2014-10-24 LAB — GLUCOSE, CAPILLARY
GLUCOSE-CAPILLARY: 170 mg/dL — AB (ref 65–99)
GLUCOSE-CAPILLARY: 146 mg/dL — AB (ref 65–99)
Glucose-Capillary: 235 mg/dL — ABNORMAL HIGH (ref 65–99)

## 2014-10-25 DIAGNOSIS — R4182 Altered mental status, unspecified: Secondary | ICD-10-CM | POA: Diagnosis not present

## 2014-10-25 LAB — GLUCOSE, CAPILLARY
Glucose-Capillary: 119 mg/dL — ABNORMAL HIGH (ref 65–99)
Glucose-Capillary: 149 mg/dL — ABNORMAL HIGH (ref 65–99)

## 2014-10-26 DIAGNOSIS — R4182 Altered mental status, unspecified: Secondary | ICD-10-CM | POA: Diagnosis not present

## 2014-10-26 LAB — GLUCOSE, CAPILLARY: Glucose-Capillary: 146 mg/dL — ABNORMAL HIGH (ref 65–99)

## 2014-10-27 DIAGNOSIS — R4182 Altered mental status, unspecified: Secondary | ICD-10-CM | POA: Diagnosis not present

## 2014-10-27 LAB — GLUCOSE, CAPILLARY
GLUCOSE-CAPILLARY: 57 mg/dL — AB (ref 65–99)
Glucose-Capillary: 103 mg/dL — ABNORMAL HIGH (ref 65–99)

## 2014-10-28 DIAGNOSIS — R4182 Altered mental status, unspecified: Secondary | ICD-10-CM | POA: Diagnosis not present

## 2014-10-28 LAB — GLUCOSE, CAPILLARY
GLUCOSE-CAPILLARY: 166 mg/dL — AB (ref 65–99)
Glucose-Capillary: 296 mg/dL — ABNORMAL HIGH (ref 65–99)

## 2014-10-29 LAB — GLUCOSE, CAPILLARY
Glucose-Capillary: 229 mg/dL — ABNORMAL HIGH (ref 65–99)
Glucose-Capillary: 80 mg/dL (ref 65–99)
Glucose-Capillary: 172 mg/dL — ABNORMAL HIGH (ref 65–99)

## 2014-10-30 DIAGNOSIS — R4182 Altered mental status, unspecified: Secondary | ICD-10-CM | POA: Diagnosis not present

## 2014-10-30 LAB — GLUCOSE, CAPILLARY: GLUCOSE-CAPILLARY: 131 mg/dL — AB (ref 65–99)

## 2014-10-31 DIAGNOSIS — R4182 Altered mental status, unspecified: Secondary | ICD-10-CM | POA: Diagnosis not present

## 2014-10-31 LAB — GLUCOSE, CAPILLARY
GLUCOSE-CAPILLARY: 134 mg/dL — AB (ref 65–99)
Glucose-Capillary: 266 mg/dL — ABNORMAL HIGH (ref 65–99)

## 2014-11-01 DIAGNOSIS — R4182 Altered mental status, unspecified: Secondary | ICD-10-CM | POA: Diagnosis not present

## 2014-11-01 LAB — GLUCOSE, CAPILLARY
Glucose-Capillary: 94 mg/dL (ref 65–99)
Glucose-Capillary: 247 mg/dL — ABNORMAL HIGH (ref 65–99)

## 2014-11-02 DIAGNOSIS — R4182 Altered mental status, unspecified: Secondary | ICD-10-CM | POA: Diagnosis not present

## 2014-11-02 LAB — GLUCOSE, CAPILLARY
GLUCOSE-CAPILLARY: 304 mg/dL — AB (ref 65–99)
Glucose-Capillary: 90 mg/dL (ref 65–99)

## 2014-11-03 LAB — GLUCOSE, CAPILLARY
GLUCOSE-CAPILLARY: 102 mg/dL — AB (ref 65–99)
GLUCOSE-CAPILLARY: 176 mg/dL — AB (ref 65–99)
Glucose-Capillary: 89 mg/dL (ref 65–99)

## 2014-11-04 DIAGNOSIS — R4182 Altered mental status, unspecified: Secondary | ICD-10-CM | POA: Diagnosis not present

## 2014-11-04 LAB — GLUCOSE, CAPILLARY
Glucose-Capillary: 112 mg/dL — ABNORMAL HIGH (ref 65–99)
Glucose-Capillary: 212 mg/dL — ABNORMAL HIGH (ref 65–99)

## 2014-11-05 DIAGNOSIS — R4182 Altered mental status, unspecified: Secondary | ICD-10-CM | POA: Diagnosis not present

## 2014-11-05 LAB — GLUCOSE, CAPILLARY
GLUCOSE-CAPILLARY: 148 mg/dL — AB (ref 65–99)
Glucose-Capillary: 114 mg/dL — ABNORMAL HIGH (ref 65–99)

## 2014-11-06 DIAGNOSIS — R4182 Altered mental status, unspecified: Secondary | ICD-10-CM | POA: Diagnosis not present

## 2014-11-06 LAB — GLUCOSE, CAPILLARY
Glucose-Capillary: 121 mg/dL — ABNORMAL HIGH (ref 65–99)
Glucose-Capillary: 198 mg/dL — ABNORMAL HIGH (ref 65–99)

## 2014-11-07 DIAGNOSIS — R4182 Altered mental status, unspecified: Secondary | ICD-10-CM | POA: Diagnosis not present

## 2014-11-07 LAB — GLUCOSE, CAPILLARY
Glucose-Capillary: 101 mg/dL — ABNORMAL HIGH (ref 65–99)
Glucose-Capillary: 193 mg/dL — ABNORMAL HIGH (ref 65–99)

## 2014-11-08 DIAGNOSIS — R4182 Altered mental status, unspecified: Secondary | ICD-10-CM | POA: Diagnosis not present

## 2014-11-08 LAB — GLUCOSE, CAPILLARY: GLUCOSE-CAPILLARY: 203 mg/dL — AB (ref 65–99)

## 2014-11-09 LAB — GLUCOSE, CAPILLARY
GLUCOSE-CAPILLARY: 81 mg/dL (ref 65–99)
GLUCOSE-CAPILLARY: 88 mg/dL (ref 65–99)
Glucose-Capillary: 145 mg/dL — ABNORMAL HIGH (ref 65–99)

## 2014-11-10 DIAGNOSIS — R4182 Altered mental status, unspecified: Secondary | ICD-10-CM | POA: Diagnosis not present

## 2014-11-10 LAB — GLUCOSE, CAPILLARY
GLUCOSE-CAPILLARY: 191 mg/dL — AB (ref 65–99)
Glucose-Capillary: 157 mg/dL — ABNORMAL HIGH (ref 65–99)

## 2014-11-11 DIAGNOSIS — R4182 Altered mental status, unspecified: Secondary | ICD-10-CM | POA: Diagnosis not present

## 2014-11-11 LAB — GLUCOSE, CAPILLARY: Glucose-Capillary: 105 mg/dL — ABNORMAL HIGH (ref 65–99)

## 2014-11-12 LAB — GLUCOSE, CAPILLARY
Glucose-Capillary: 239 mg/dL — ABNORMAL HIGH (ref 65–99)
Glucose-Capillary: 86 mg/dL (ref 65–99)

## 2014-11-13 DIAGNOSIS — R4182 Altered mental status, unspecified: Secondary | ICD-10-CM | POA: Diagnosis not present

## 2014-11-13 LAB — GLUCOSE, CAPILLARY
Glucose-Capillary: 278 mg/dL — ABNORMAL HIGH (ref 65–99)
Glucose-Capillary: 142 mg/dL — ABNORMAL HIGH (ref 65–99)

## 2014-11-14 DIAGNOSIS — R4182 Altered mental status, unspecified: Secondary | ICD-10-CM | POA: Diagnosis not present

## 2014-11-14 LAB — GLUCOSE, CAPILLARY
Glucose-Capillary: 58 mg/dL — ABNORMAL LOW (ref 65–99)
Glucose-Capillary: 181 mg/dL — ABNORMAL HIGH (ref 65–99)

## 2014-11-15 ENCOUNTER — Encounter
Admission: RE | Admit: 2014-11-15 | Discharge: 2014-11-15 | Disposition: A | Discharge status: Home / Self Care | Payer: Medicare Other | Source: Ambulatory Visit | Attending: Internal Medicine | Admitting: Internal Medicine

## 2014-11-15 DIAGNOSIS — E119 Type 2 diabetes mellitus without complications: Secondary | ICD-10-CM | POA: Insufficient documentation

## 2014-11-15 DIAGNOSIS — R509 Fever, unspecified: Secondary | ICD-10-CM | POA: Insufficient documentation

## 2014-11-15 LAB — GLUCOSE, CAPILLARY
GLUCOSE-CAPILLARY: 221 mg/dL — AB (ref 65–99)
Glucose-Capillary: 104 mg/dL — ABNORMAL HIGH (ref 65–99)

## 2014-11-16 LAB — GLUCOSE, CAPILLARY
GLUCOSE-CAPILLARY: 75 mg/dL (ref 65–99)
Glucose-Capillary: 75 mg/dL (ref 65–99)
Glucose-Capillary: 208 mg/dL — ABNORMAL HIGH (ref 65–99)

## 2014-11-17 DIAGNOSIS — R509 Fever, unspecified: Secondary | ICD-10-CM | POA: Diagnosis not present

## 2014-11-17 LAB — GLUCOSE, CAPILLARY
Glucose-Capillary: 103 mg/dL — ABNORMAL HIGH (ref 65–99)
Glucose-Capillary: 189 mg/dL — ABNORMAL HIGH (ref 65–99)

## 2014-11-18 DIAGNOSIS — R509 Fever, unspecified: Secondary | ICD-10-CM | POA: Diagnosis not present

## 2014-11-18 LAB — GLUCOSE, CAPILLARY: GLUCOSE-CAPILLARY: 222 mg/dL — AB (ref 65–99)

## 2014-11-19 LAB — GLUCOSE, CAPILLARY
GLUCOSE-CAPILLARY: 86 mg/dL (ref 65–99)
GLUCOSE-CAPILLARY: 230 mg/dL — AB (ref 65–99)

## 2014-11-20 DIAGNOSIS — R509 Fever, unspecified: Secondary | ICD-10-CM | POA: Diagnosis not present

## 2014-11-20 LAB — GLUCOSE, CAPILLARY
Glucose-Capillary: 90 mg/dL (ref 65–99)
Glucose-Capillary: 154 mg/dL — ABNORMAL HIGH (ref 65–99)

## 2014-11-21 DIAGNOSIS — R509 Fever, unspecified: Secondary | ICD-10-CM | POA: Diagnosis not present

## 2014-11-21 LAB — CBC WITH DIFFERENTIAL/PLATELET
Basophils Absolute: 0 10*3/uL (ref 0–0.1)
Basophils Relative: 0 %
Eosinophils Absolute: 0.3 10*3/uL (ref 0–0.7)
Eosinophils Relative: 2 %
HEMATOCRIT: 41 % (ref 35.0–47.0)
Hemoglobin: 13.5 g/dL (ref 12.0–16.0)
LYMPHS PCT: 28 %
Lymphs Abs: 4.3 10*3/uL — ABNORMAL HIGH (ref 1.0–3.6)
MCH: 31.8 pg (ref 26.0–34.0)
MCHC: 33.1 g/dL (ref 32.0–36.0)
MCV: 96.1 fL (ref 80.0–100.0)
MONO ABS: 1 10*3/uL — AB (ref 0.2–0.9)
MONOS PCT: 6 %
NEUTROS ABS: 9.7 10*3/uL — AB (ref 1.4–6.5)
Neutrophils Relative %: 64 %
Platelets: 255 10*3/uL (ref 150–440)
RBC: 4.26 MIL/uL (ref 3.80–5.20)
RDW: 13 % (ref 11.5–14.5)
WBC: 15.3 10*3/uL — ABNORMAL HIGH (ref 3.6–11.0)

## 2014-11-21 LAB — GLUCOSE, CAPILLARY
GLUCOSE-CAPILLARY: 105 mg/dL — AB (ref 65–99)
Glucose-Capillary: 215 mg/dL — ABNORMAL HIGH (ref 65–99)
Glucose-Capillary: 150 mg/dL — ABNORMAL HIGH (ref 65–99)

## 2014-11-21 LAB — URINALYSIS COMPLETE WITH MICROSCOPIC (ARMC ONLY)
BACTERIA UA: NONE SEEN
Bilirubin Urine: NEGATIVE
GLUCOSE, UA: NEGATIVE mg/dL
HGB URINE DIPSTICK: NEGATIVE
Ketones, ur: NEGATIVE mg/dL
LEUKOCYTES UA: NEGATIVE
Nitrite: NEGATIVE
Protein, ur: NEGATIVE mg/dL
RBC / HPF: NONE SEEN RBC/hpf (ref 0–5)
Specific Gravity, Urine: 1.013 (ref 1.005–1.030)
pH: 5 (ref 5.0–8.0)

## 2014-11-21 LAB — COMPREHENSIVE METABOLIC PANEL
ALBUMIN: 3.4 g/dL — AB (ref 3.5–5.0)
ALK PHOS: 63 U/L (ref 38–126)
ALT: 15 U/L (ref 14–54)
ANION GAP: 11 (ref 5–15)
AST: 18 U/L (ref 15–41)
BUN: 30 mg/dL — ABNORMAL HIGH (ref 6–20)
CALCIUM: 9.5 mg/dL (ref 8.9–10.3)
CHLORIDE: 102 mmol/L (ref 101–111)
CO2: 27 mmol/L (ref 22–32)
Creatinine, Ser: UNDETERMINED mg/dL (ref 0.44–1.00)
GLUCOSE: 139 mg/dL — AB (ref 65–99)
Potassium: 3.8 mmol/L (ref 3.5–5.1)
SODIUM: 140 mmol/L (ref 135–145)
Total Bilirubin: 0.3 mg/dL (ref 0.3–1.2)
Total Protein: 6.8 g/dL (ref 6.5–8.1)

## 2014-11-21 LAB — TSH: TSH: 4.6 u[IU]/mL — ABNORMAL HIGH (ref 0.350–4.500)

## 2014-11-22 LAB — GLUCOSE, CAPILLARY
Glucose-Capillary: 307 mg/dL — ABNORMAL HIGH (ref 65–99)
Glucose-Capillary: 40 mg/dL — CL (ref 65–99)
Glucose-Capillary: 75 mg/dL (ref 65–99)
Glucose-Capillary: 81 mg/dL (ref 65–99)

## 2014-11-23 DIAGNOSIS — R509 Fever, unspecified: Secondary | ICD-10-CM | POA: Diagnosis not present

## 2014-11-23 LAB — URINE CULTURE: CULTURE: NO GROWTH

## 2014-11-23 LAB — GLUCOSE, CAPILLARY
GLUCOSE-CAPILLARY: 90 mg/dL (ref 65–99)
Glucose-Capillary: 180 mg/dL — ABNORMAL HIGH (ref 65–99)

## 2014-11-24 DIAGNOSIS — R509 Fever, unspecified: Secondary | ICD-10-CM | POA: Diagnosis not present

## 2014-11-24 LAB — GLUCOSE, CAPILLARY
GLUCOSE-CAPILLARY: 77 mg/dL (ref 65–99)
Glucose-Capillary: 256 mg/dL — ABNORMAL HIGH (ref 65–99)

## 2014-11-25 DIAGNOSIS — R509 Fever, unspecified: Secondary | ICD-10-CM | POA: Diagnosis not present

## 2014-11-25 LAB — GLUCOSE, CAPILLARY
GLUCOSE-CAPILLARY: 87 mg/dL (ref 65–99)
Glucose-Capillary: 151 mg/dL — ABNORMAL HIGH (ref 65–99)

## 2014-11-26 DIAGNOSIS — R509 Fever, unspecified: Secondary | ICD-10-CM | POA: Diagnosis not present

## 2014-11-26 LAB — GLUCOSE, CAPILLARY
GLUCOSE-CAPILLARY: 118 mg/dL — AB (ref 65–99)
Glucose-Capillary: 85 mg/dL (ref 65–99)

## 2014-11-27 DIAGNOSIS — R509 Fever, unspecified: Secondary | ICD-10-CM | POA: Diagnosis not present

## 2014-11-27 LAB — GLUCOSE, CAPILLARY: GLUCOSE-CAPILLARY: 229 mg/dL — AB (ref 65–99)

## 2014-11-28 DIAGNOSIS — R509 Fever, unspecified: Secondary | ICD-10-CM | POA: Diagnosis not present

## 2014-11-28 LAB — GLUCOSE, CAPILLARY
GLUCOSE-CAPILLARY: 62 mg/dL — AB (ref 65–99)
GLUCOSE-CAPILLARY: 102 mg/dL — AB (ref 65–99)

## 2014-11-29 DIAGNOSIS — R509 Fever, unspecified: Secondary | ICD-10-CM | POA: Diagnosis not present

## 2014-11-29 LAB — GLUCOSE, CAPILLARY
GLUCOSE-CAPILLARY: 145 mg/dL — AB (ref 65–99)
GLUCOSE-CAPILLARY: 230 mg/dL — AB (ref 65–99)

## 2014-11-30 DIAGNOSIS — R509 Fever, unspecified: Secondary | ICD-10-CM | POA: Diagnosis not present

## 2014-11-30 LAB — GLUCOSE, CAPILLARY
GLUCOSE-CAPILLARY: 80 mg/dL (ref 65–99)
Glucose-Capillary: 261 mg/dL — ABNORMAL HIGH (ref 65–99)

## 2014-12-01 LAB — GLUCOSE, CAPILLARY
GLUCOSE-CAPILLARY: 252 mg/dL — AB (ref 65–99)
GLUCOSE-CAPILLARY: 136 mg/dL — AB (ref 65–99)
Glucose-Capillary: 81 mg/dL (ref 65–99)

## 2014-12-02 DIAGNOSIS — R509 Fever, unspecified: Secondary | ICD-10-CM | POA: Diagnosis not present

## 2014-12-02 LAB — GLUCOSE, CAPILLARY: GLUCOSE-CAPILLARY: 166 mg/dL — AB (ref 65–99)

## 2014-12-03 LAB — GLUCOSE, CAPILLARY
Glucose-Capillary: 148 mg/dL — ABNORMAL HIGH (ref 65–99)
Glucose-Capillary: 211 mg/dL — ABNORMAL HIGH (ref 65–99)

## 2014-12-04 DIAGNOSIS — R509 Fever, unspecified: Secondary | ICD-10-CM | POA: Diagnosis not present

## 2014-12-04 LAB — GLUCOSE, CAPILLARY
GLUCOSE-CAPILLARY: 113 mg/dL — AB (ref 65–99)
GLUCOSE-CAPILLARY: 244 mg/dL — AB (ref 65–99)

## 2014-12-05 DIAGNOSIS — R509 Fever, unspecified: Secondary | ICD-10-CM | POA: Diagnosis not present

## 2014-12-05 LAB — GLUCOSE, CAPILLARY
Glucose-Capillary: 137 mg/dL — ABNORMAL HIGH (ref 65–99)
Glucose-Capillary: 119 mg/dL — ABNORMAL HIGH (ref 65–99)
Glucose-Capillary: 153 mg/dL — ABNORMAL HIGH (ref 65–99)

## 2014-12-06 DIAGNOSIS — R509 Fever, unspecified: Secondary | ICD-10-CM | POA: Diagnosis not present

## 2014-12-06 LAB — GLUCOSE, CAPILLARY
GLUCOSE-CAPILLARY: 167 mg/dL — AB (ref 65–99)
GLUCOSE-CAPILLARY: 212 mg/dL — AB (ref 65–99)

## 2014-12-07 DIAGNOSIS — R509 Fever, unspecified: Secondary | ICD-10-CM | POA: Diagnosis not present

## 2014-12-07 LAB — GLUCOSE, CAPILLARY
GLUCOSE-CAPILLARY: 146 mg/dL — AB (ref 65–99)
GLUCOSE-CAPILLARY: 107 mg/dL — AB (ref 65–99)

## 2014-12-08 DIAGNOSIS — R509 Fever, unspecified: Secondary | ICD-10-CM | POA: Diagnosis not present

## 2014-12-08 LAB — GLUCOSE, CAPILLARY: Glucose-Capillary: 154 mg/dL — ABNORMAL HIGH (ref 65–99)

## 2014-12-09 LAB — GLUCOSE, CAPILLARY
GLUCOSE-CAPILLARY: 95 mg/dL (ref 65–99)
Glucose-Capillary: 98 mg/dL (ref 65–99)
Glucose-Capillary: 144 mg/dL — ABNORMAL HIGH (ref 65–99)

## 2014-12-10 DIAGNOSIS — R509 Fever, unspecified: Secondary | ICD-10-CM | POA: Diagnosis not present

## 2014-12-10 LAB — GLUCOSE, CAPILLARY
Glucose-Capillary: 90 mg/dL (ref 65–99)
Glucose-Capillary: 186 mg/dL — ABNORMAL HIGH (ref 65–99)

## 2014-12-12 DIAGNOSIS — R509 Fever, unspecified: Secondary | ICD-10-CM | POA: Diagnosis not present

## 2014-12-12 LAB — GLUCOSE, CAPILLARY
GLUCOSE-CAPILLARY: 93 mg/dL (ref 65–99)
GLUCOSE-CAPILLARY: 185 mg/dL — AB (ref 65–99)

## 2014-12-13 DIAGNOSIS — R509 Fever, unspecified: Secondary | ICD-10-CM | POA: Diagnosis not present

## 2014-12-13 LAB — GLUCOSE, CAPILLARY
GLUCOSE-CAPILLARY: 87 mg/dL (ref 65–99)
Glucose-Capillary: 117 mg/dL — ABNORMAL HIGH (ref 65–99)

## 2014-12-14 LAB — GLUCOSE, CAPILLARY
GLUCOSE-CAPILLARY: 135 mg/dL — AB (ref 65–99)
Glucose-Capillary: 154 mg/dL — ABNORMAL HIGH (ref 65–99)
Glucose-Capillary: 263 mg/dL — ABNORMAL HIGH (ref 65–99)

## 2014-12-15 DIAGNOSIS — R509 Fever, unspecified: Secondary | ICD-10-CM | POA: Diagnosis not present

## 2014-12-15 LAB — GLUCOSE, CAPILLARY: Glucose-Capillary: 117 mg/dL — ABNORMAL HIGH (ref 65–99)

## 2014-12-16 ENCOUNTER — Encounter
Admission: RE | Admit: 2014-12-16 | Discharge: 2014-12-16 | Disposition: A | Discharge status: Home / Self Care | Payer: Medicare Other | Source: Ambulatory Visit | Attending: Internal Medicine | Admitting: Internal Medicine

## 2014-12-16 DIAGNOSIS — R509 Fever, unspecified: Secondary | ICD-10-CM | POA: Insufficient documentation

## 2014-12-16 LAB — GLUCOSE, CAPILLARY
GLUCOSE-CAPILLARY: 253 mg/dL — AB (ref 65–99)
GLUCOSE-CAPILLARY: 179 mg/dL — AB (ref 65–99)

## 2014-12-17 DIAGNOSIS — R509 Fever, unspecified: Secondary | ICD-10-CM | POA: Diagnosis not present

## 2014-12-17 LAB — GLUCOSE, CAPILLARY
GLUCOSE-CAPILLARY: 89 mg/dL (ref 65–99)
GLUCOSE-CAPILLARY: 254 mg/dL — AB (ref 65–99)

## 2014-12-18 DIAGNOSIS — R509 Fever, unspecified: Secondary | ICD-10-CM | POA: Diagnosis not present

## 2014-12-18 LAB — GLUCOSE, CAPILLARY
Glucose-Capillary: 112 mg/dL — ABNORMAL HIGH (ref 65–99)
Glucose-Capillary: 133 mg/dL — ABNORMAL HIGH (ref 65–99)

## 2014-12-19 DIAGNOSIS — R509 Fever, unspecified: Secondary | ICD-10-CM | POA: Diagnosis not present

## 2014-12-19 LAB — GLUCOSE, CAPILLARY
Glucose-Capillary: 89 mg/dL (ref 65–99)
Glucose-Capillary: 97 mg/dL (ref 65–99)

## 2014-12-20 DIAGNOSIS — R509 Fever, unspecified: Secondary | ICD-10-CM | POA: Diagnosis not present

## 2014-12-20 LAB — GLUCOSE, CAPILLARY
GLUCOSE-CAPILLARY: 171 mg/dL — AB (ref 65–99)
Glucose-Capillary: 90 mg/dL (ref 65–99)

## 2014-12-21 DIAGNOSIS — R509 Fever, unspecified: Secondary | ICD-10-CM | POA: Diagnosis not present

## 2014-12-21 LAB — GLUCOSE, CAPILLARY
GLUCOSE-CAPILLARY: 206 mg/dL — AB (ref 65–99)
Glucose-Capillary: 153 mg/dL — ABNORMAL HIGH (ref 65–99)

## 2014-12-22 DIAGNOSIS — R509 Fever, unspecified: Secondary | ICD-10-CM | POA: Diagnosis not present

## 2014-12-22 LAB — GLUCOSE, CAPILLARY
GLUCOSE-CAPILLARY: 100 mg/dL — AB (ref 65–99)
Glucose-Capillary: 274 mg/dL — ABNORMAL HIGH (ref 65–99)

## 2014-12-23 DIAGNOSIS — R509 Fever, unspecified: Secondary | ICD-10-CM | POA: Diagnosis not present

## 2014-12-23 LAB — GLUCOSE, CAPILLARY
Glucose-Capillary: 155 mg/dL — ABNORMAL HIGH (ref 65–99)
Glucose-Capillary: 115 mg/dL — ABNORMAL HIGH (ref 65–99)

## 2014-12-24 DIAGNOSIS — R509 Fever, unspecified: Secondary | ICD-10-CM | POA: Diagnosis not present

## 2014-12-24 LAB — GLUCOSE, CAPILLARY
GLUCOSE-CAPILLARY: 102 mg/dL — AB (ref 65–99)
GLUCOSE-CAPILLARY: 196 mg/dL — AB (ref 65–99)

## 2014-12-25 DIAGNOSIS — R509 Fever, unspecified: Secondary | ICD-10-CM | POA: Diagnosis not present

## 2014-12-25 LAB — GLUCOSE, CAPILLARY: GLUCOSE-CAPILLARY: 136 mg/dL — AB (ref 65–99)

## 2014-12-26 DIAGNOSIS — R509 Fever, unspecified: Secondary | ICD-10-CM | POA: Diagnosis not present

## 2014-12-26 LAB — GLUCOSE, CAPILLARY
Glucose-Capillary: 89 mg/dL (ref 65–99)
Glucose-Capillary: 273 mg/dL — ABNORMAL HIGH (ref 65–99)

## 2014-12-27 DIAGNOSIS — R509 Fever, unspecified: Secondary | ICD-10-CM | POA: Diagnosis not present

## 2014-12-27 LAB — GLUCOSE, CAPILLARY
Glucose-Capillary: 190 mg/dL — ABNORMAL HIGH (ref 65–99)
Glucose-Capillary: 149 mg/dL — ABNORMAL HIGH (ref 65–99)

## 2014-12-28 DIAGNOSIS — R509 Fever, unspecified: Secondary | ICD-10-CM | POA: Diagnosis not present

## 2014-12-28 LAB — GLUCOSE, CAPILLARY
GLUCOSE-CAPILLARY: 95 mg/dL (ref 65–99)
Glucose-Capillary: 194 mg/dL — ABNORMAL HIGH (ref 65–99)

## 2014-12-29 DIAGNOSIS — R509 Fever, unspecified: Secondary | ICD-10-CM | POA: Diagnosis not present

## 2014-12-29 LAB — GLUCOSE, CAPILLARY
GLUCOSE-CAPILLARY: 224 mg/dL — AB (ref 65–99)
Glucose-Capillary: 128 mg/dL — ABNORMAL HIGH (ref 65–99)

## 2014-12-30 DIAGNOSIS — R509 Fever, unspecified: Secondary | ICD-10-CM | POA: Diagnosis not present

## 2014-12-30 LAB — GLUCOSE, CAPILLARY
GLUCOSE-CAPILLARY: 196 mg/dL — AB (ref 65–99)
Glucose-Capillary: 152 mg/dL — ABNORMAL HIGH (ref 65–99)

## 2014-12-31 DIAGNOSIS — R509 Fever, unspecified: Secondary | ICD-10-CM | POA: Diagnosis not present

## 2014-12-31 LAB — GLUCOSE, CAPILLARY
GLUCOSE-CAPILLARY: 124 mg/dL — AB (ref 65–99)
GLUCOSE-CAPILLARY: 277 mg/dL — AB (ref 65–99)

## 2015-01-01 DIAGNOSIS — R509 Fever, unspecified: Secondary | ICD-10-CM | POA: Diagnosis not present

## 2015-01-01 LAB — GLUCOSE, CAPILLARY
GLUCOSE-CAPILLARY: 135 mg/dL — AB (ref 65–99)
GLUCOSE-CAPILLARY: 192 mg/dL — AB (ref 65–99)

## 2015-01-02 DIAGNOSIS — R509 Fever, unspecified: Secondary | ICD-10-CM | POA: Diagnosis not present

## 2015-01-02 LAB — GLUCOSE, CAPILLARY
GLUCOSE-CAPILLARY: 156 mg/dL — AB (ref 65–99)
Glucose-Capillary: 107 mg/dL — ABNORMAL HIGH (ref 65–99)

## 2015-01-03 DIAGNOSIS — R509 Fever, unspecified: Secondary | ICD-10-CM | POA: Diagnosis not present

## 2015-01-03 LAB — GLUCOSE, CAPILLARY
GLUCOSE-CAPILLARY: 202 mg/dL — AB (ref 65–99)
Glucose-Capillary: 158 mg/dL — ABNORMAL HIGH (ref 65–99)

## 2015-01-04 DIAGNOSIS — R509 Fever, unspecified: Secondary | ICD-10-CM | POA: Diagnosis not present

## 2015-01-04 LAB — GLUCOSE, CAPILLARY
GLUCOSE-CAPILLARY: 123 mg/dL — AB (ref 65–99)
Glucose-Capillary: 128 mg/dL — ABNORMAL HIGH (ref 65–99)

## 2015-01-05 DIAGNOSIS — R509 Fever, unspecified: Secondary | ICD-10-CM | POA: Diagnosis not present

## 2015-01-05 LAB — GLUCOSE, CAPILLARY
GLUCOSE-CAPILLARY: 121 mg/dL — AB (ref 65–99)
Glucose-Capillary: 184 mg/dL — ABNORMAL HIGH (ref 65–99)

## 2015-01-06 DIAGNOSIS — R509 Fever, unspecified: Secondary | ICD-10-CM | POA: Diagnosis not present

## 2015-01-06 LAB — GLUCOSE, CAPILLARY
GLUCOSE-CAPILLARY: 148 mg/dL — AB (ref 65–99)
GLUCOSE-CAPILLARY: 185 mg/dL — AB (ref 65–99)

## 2015-01-07 DIAGNOSIS — R509 Fever, unspecified: Secondary | ICD-10-CM | POA: Diagnosis not present

## 2015-01-07 LAB — GLUCOSE, CAPILLARY
GLUCOSE-CAPILLARY: 105 mg/dL — AB (ref 65–99)
Glucose-Capillary: 97 mg/dL (ref 65–99)

## 2015-01-08 DIAGNOSIS — R509 Fever, unspecified: Secondary | ICD-10-CM | POA: Diagnosis not present

## 2015-01-08 LAB — GLUCOSE, CAPILLARY: Glucose-Capillary: 191 mg/dL — ABNORMAL HIGH (ref 65–99)

## 2015-01-09 DIAGNOSIS — R509 Fever, unspecified: Secondary | ICD-10-CM | POA: Diagnosis not present

## 2015-01-09 LAB — GLUCOSE, CAPILLARY
Glucose-Capillary: 114 mg/dL — ABNORMAL HIGH (ref 65–99)
Glucose-Capillary: 190 mg/dL — ABNORMAL HIGH (ref 65–99)

## 2015-01-10 DIAGNOSIS — R509 Fever, unspecified: Secondary | ICD-10-CM | POA: Diagnosis not present

## 2015-01-10 LAB — GLUCOSE, CAPILLARY
GLUCOSE-CAPILLARY: 125 mg/dL — AB (ref 65–99)
GLUCOSE-CAPILLARY: 207 mg/dL — AB (ref 65–99)

## 2015-01-11 DIAGNOSIS — R509 Fever, unspecified: Secondary | ICD-10-CM | POA: Diagnosis not present

## 2015-01-11 LAB — GLUCOSE, CAPILLARY
GLUCOSE-CAPILLARY: 176 mg/dL — AB (ref 65–99)
Glucose-Capillary: 229 mg/dL — ABNORMAL HIGH (ref 65–99)

## 2015-01-12 DIAGNOSIS — R509 Fever, unspecified: Secondary | ICD-10-CM | POA: Diagnosis not present

## 2015-01-12 LAB — GLUCOSE, CAPILLARY
GLUCOSE-CAPILLARY: 133 mg/dL — AB (ref 65–99)
Glucose-Capillary: 118 mg/dL — ABNORMAL HIGH (ref 65–99)

## 2015-01-13 DIAGNOSIS — R509 Fever, unspecified: Secondary | ICD-10-CM | POA: Diagnosis not present

## 2015-01-13 LAB — GLUCOSE, CAPILLARY
GLUCOSE-CAPILLARY: 94 mg/dL (ref 65–99)
Glucose-Capillary: 200 mg/dL — ABNORMAL HIGH (ref 65–99)

## 2015-01-14 DIAGNOSIS — R509 Fever, unspecified: Secondary | ICD-10-CM | POA: Diagnosis not present

## 2015-01-14 LAB — GLUCOSE, CAPILLARY
GLUCOSE-CAPILLARY: 138 mg/dL — AB (ref 65–99)
Glucose-Capillary: 208 mg/dL — ABNORMAL HIGH (ref 65–99)

## 2015-01-15 ENCOUNTER — Encounter
Admission: RE | Admit: 2015-01-15 | Discharge: 2015-01-15 | Disposition: A | Discharge status: Home / Self Care | Payer: Medicare Other | Source: Ambulatory Visit | Attending: Internal Medicine | Admitting: Internal Medicine

## 2015-01-15 DIAGNOSIS — R410 Disorientation, unspecified: Secondary | ICD-10-CM | POA: Diagnosis not present

## 2015-01-15 DIAGNOSIS — E119 Type 2 diabetes mellitus without complications: Secondary | ICD-10-CM | POA: Diagnosis present

## 2015-01-15 LAB — GLUCOSE, CAPILLARY
GLUCOSE-CAPILLARY: 135 mg/dL — AB (ref 65–99)
Glucose-Capillary: 271 mg/dL — ABNORMAL HIGH (ref 65–99)

## 2015-01-25 DIAGNOSIS — E119 Type 2 diabetes mellitus without complications: Secondary | ICD-10-CM | POA: Diagnosis not present

## 2015-01-25 LAB — URINALYSIS COMPLETE WITH MICROSCOPIC (ARMC ONLY)
BACTERIA UA: NONE SEEN
Bilirubin Urine: NEGATIVE
Glucose, UA: NEGATIVE mg/dL
Hgb urine dipstick: NEGATIVE
Ketones, ur: NEGATIVE mg/dL
Leukocytes, UA: NEGATIVE
Nitrite: NEGATIVE
PH: 6 (ref 5.0–8.0)
PROTEIN: NEGATIVE mg/dL
RBC / HPF: NONE SEEN RBC/hpf (ref 0–5)
Specific Gravity, Urine: 1.017 (ref 1.005–1.030)

## 2015-01-27 LAB — URINE CULTURE

## 2015-02-06 DIAGNOSIS — E119 Type 2 diabetes mellitus without complications: Secondary | ICD-10-CM | POA: Diagnosis not present

## 2015-02-06 LAB — GLUCOSE, CAPILLARY
GLUCOSE-CAPILLARY: 87 mg/dL (ref 65–99)
GLUCOSE-CAPILLARY: 195 mg/dL — AB (ref 65–99)

## 2015-02-07 DIAGNOSIS — E119 Type 2 diabetes mellitus without complications: Secondary | ICD-10-CM | POA: Diagnosis not present

## 2015-02-07 LAB — GLUCOSE, CAPILLARY
GLUCOSE-CAPILLARY: 120 mg/dL — AB (ref 65–99)
Glucose-Capillary: 198 mg/dL — ABNORMAL HIGH (ref 65–99)

## 2015-02-08 DIAGNOSIS — E119 Type 2 diabetes mellitus without complications: Secondary | ICD-10-CM | POA: Diagnosis not present

## 2015-02-08 LAB — GLUCOSE, CAPILLARY
GLUCOSE-CAPILLARY: 117 mg/dL — AB (ref 65–99)
GLUCOSE-CAPILLARY: 216 mg/dL — AB (ref 65–99)

## 2015-02-09 DIAGNOSIS — E119 Type 2 diabetes mellitus without complications: Secondary | ICD-10-CM | POA: Diagnosis not present

## 2015-02-09 LAB — GLUCOSE, CAPILLARY
GLUCOSE-CAPILLARY: 133 mg/dL — AB (ref 65–99)
GLUCOSE-CAPILLARY: 153 mg/dL — AB (ref 65–99)

## 2015-02-10 DIAGNOSIS — E119 Type 2 diabetes mellitus without complications: Secondary | ICD-10-CM | POA: Diagnosis not present

## 2015-02-10 LAB — GLUCOSE, CAPILLARY
GLUCOSE-CAPILLARY: 89 mg/dL (ref 65–99)
GLUCOSE-CAPILLARY: 211 mg/dL — AB (ref 65–99)

## 2015-02-11 DIAGNOSIS — E119 Type 2 diabetes mellitus without complications: Secondary | ICD-10-CM | POA: Diagnosis not present

## 2015-02-11 LAB — GLUCOSE, CAPILLARY
GLUCOSE-CAPILLARY: 106 mg/dL — AB (ref 65–99)
Glucose-Capillary: 246 mg/dL — ABNORMAL HIGH (ref 65–99)

## 2015-02-12 DIAGNOSIS — E119 Type 2 diabetes mellitus without complications: Secondary | ICD-10-CM | POA: Diagnosis not present

## 2015-02-12 LAB — GLUCOSE, CAPILLARY
GLUCOSE-CAPILLARY: 80 mg/dL (ref 65–99)
GLUCOSE-CAPILLARY: 283 mg/dL — AB (ref 65–99)

## 2015-02-13 DIAGNOSIS — E119 Type 2 diabetes mellitus without complications: Secondary | ICD-10-CM | POA: Diagnosis not present

## 2015-02-13 LAB — GLUCOSE, CAPILLARY
GLUCOSE-CAPILLARY: 154 mg/dL — AB (ref 65–99)
Glucose-Capillary: 161 mg/dL — ABNORMAL HIGH (ref 65–99)

## 2015-02-14 DIAGNOSIS — E119 Type 2 diabetes mellitus without complications: Secondary | ICD-10-CM | POA: Diagnosis not present

## 2015-02-14 LAB — GLUCOSE, CAPILLARY
GLUCOSE-CAPILLARY: 98 mg/dL (ref 65–99)
Glucose-Capillary: 127 mg/dL — ABNORMAL HIGH (ref 65–99)
Glucose-Capillary: 176 mg/dL — ABNORMAL HIGH (ref 65–99)

## 2015-02-15 ENCOUNTER — Encounter
Admission: RE | Admit: 2015-02-15 | Discharge: 2015-02-15 | Disposition: A | Discharge status: Home / Self Care | Payer: Medicare Other | Source: Ambulatory Visit | Attending: Internal Medicine | Admitting: Internal Medicine

## 2015-02-15 DIAGNOSIS — R410 Disorientation, unspecified: Secondary | ICD-10-CM | POA: Diagnosis not present

## 2015-02-15 DIAGNOSIS — R5381 Other malaise: Secondary | ICD-10-CM | POA: Diagnosis present

## 2015-02-15 DIAGNOSIS — E119 Type 2 diabetes mellitus without complications: Secondary | ICD-10-CM | POA: Insufficient documentation

## 2015-02-15 DIAGNOSIS — R231 Pallor: Secondary | ICD-10-CM | POA: Insufficient documentation

## 2015-02-15 DIAGNOSIS — E876 Hypokalemia: Secondary | ICD-10-CM | POA: Insufficient documentation

## 2015-02-16 DIAGNOSIS — R5381 Other malaise: Secondary | ICD-10-CM | POA: Diagnosis not present

## 2015-02-16 LAB — GLUCOSE, CAPILLARY
GLUCOSE-CAPILLARY: 117 mg/dL — AB (ref 65–99)
Glucose-Capillary: 240 mg/dL — ABNORMAL HIGH (ref 65–99)

## 2015-02-17 DIAGNOSIS — R5381 Other malaise: Secondary | ICD-10-CM | POA: Diagnosis not present

## 2015-02-17 LAB — GLUCOSE, CAPILLARY
GLUCOSE-CAPILLARY: 295 mg/dL — AB (ref 65–99)
Glucose-Capillary: 146 mg/dL — ABNORMAL HIGH (ref 65–99)

## 2015-02-18 DIAGNOSIS — R5381 Other malaise: Secondary | ICD-10-CM | POA: Diagnosis not present

## 2015-02-18 LAB — URINALYSIS COMPLETE WITH MICROSCOPIC (ARMC ONLY)
BILIRUBIN URINE: NEGATIVE
Bacteria, UA: NONE SEEN
GLUCOSE, UA: NEGATIVE mg/dL
Hgb urine dipstick: NEGATIVE
Ketones, ur: NEGATIVE mg/dL
Leukocytes, UA: NEGATIVE
Nitrite: NEGATIVE
Protein, ur: NEGATIVE mg/dL
Specific Gravity, Urine: 1.016 (ref 1.005–1.030)
pH: 5 (ref 5.0–8.0)

## 2015-02-18 LAB — GLUCOSE, CAPILLARY
GLUCOSE-CAPILLARY: 143 mg/dL — AB (ref 65–99)
Glucose-Capillary: 209 mg/dL — ABNORMAL HIGH (ref 65–99)

## 2015-02-19 DIAGNOSIS — R5381 Other malaise: Secondary | ICD-10-CM | POA: Diagnosis not present

## 2015-02-19 LAB — GLUCOSE, CAPILLARY
GLUCOSE-CAPILLARY: 99 mg/dL (ref 65–99)
GLUCOSE-CAPILLARY: 231 mg/dL — AB (ref 65–99)

## 2015-02-20 DIAGNOSIS — R5381 Other malaise: Secondary | ICD-10-CM | POA: Diagnosis not present

## 2015-02-20 LAB — GLUCOSE, CAPILLARY
GLUCOSE-CAPILLARY: 135 mg/dL — AB (ref 65–99)
GLUCOSE-CAPILLARY: 270 mg/dL — AB (ref 65–99)

## 2015-02-20 LAB — URINE CULTURE: CULTURE: NO GROWTH

## 2015-02-21 DIAGNOSIS — R5381 Other malaise: Secondary | ICD-10-CM | POA: Diagnosis not present

## 2015-02-21 LAB — GLUCOSE, CAPILLARY
Glucose-Capillary: 120 mg/dL — ABNORMAL HIGH (ref 65–99)
Glucose-Capillary: 286 mg/dL — ABNORMAL HIGH (ref 65–99)

## 2015-02-22 DIAGNOSIS — R5381 Other malaise: Secondary | ICD-10-CM | POA: Diagnosis not present

## 2015-02-22 LAB — GLUCOSE, CAPILLARY
GLUCOSE-CAPILLARY: 227 mg/dL — AB (ref 65–99)
Glucose-Capillary: 134 mg/dL — ABNORMAL HIGH (ref 65–99)
Glucose-Capillary: 185 mg/dL — ABNORMAL HIGH (ref 65–99)

## 2015-02-23 DIAGNOSIS — R5381 Other malaise: Secondary | ICD-10-CM | POA: Diagnosis not present

## 2015-02-23 LAB — CBC WITH DIFFERENTIAL/PLATELET
BASOS PCT: 0 %
Basophils Absolute: 0 10*3/uL (ref 0–0.1)
EOS ABS: 0.2 10*3/uL (ref 0–0.7)
Eosinophils Relative: 2 %
HCT: 37 % (ref 35.0–47.0)
HEMOGLOBIN: 12.2 g/dL (ref 12.0–16.0)
Lymphocytes Relative: 23 %
Lymphs Abs: 3 10*3/uL (ref 1.0–3.6)
MCH: 31.6 pg (ref 26.0–34.0)
MCHC: 33.1 g/dL (ref 32.0–36.0)
MCV: 95.6 fL (ref 80.0–100.0)
MONOS PCT: 6 %
Monocytes Absolute: 0.8 10*3/uL (ref 0.2–0.9)
NEUTROS PCT: 69 %
Neutro Abs: 9.2 10*3/uL — ABNORMAL HIGH (ref 1.4–6.5)
Platelets: 229 10*3/uL (ref 150–440)
RBC: 3.87 MIL/uL (ref 3.80–5.20)
RDW: 13 % (ref 11.5–14.5)
WBC: 13.3 10*3/uL — ABNORMAL HIGH (ref 3.6–11.0)

## 2015-02-23 LAB — GLUCOSE, CAPILLARY
Glucose-Capillary: 90 mg/dL (ref 65–99)
Glucose-Capillary: 131 mg/dL — ABNORMAL HIGH (ref 65–99)

## 2015-02-23 LAB — COMPREHENSIVE METABOLIC PANEL
ALBUMIN: 3.2 g/dL — AB (ref 3.5–5.0)
ALK PHOS: 59 U/L (ref 38–126)
ALT: 9 U/L — ABNORMAL LOW (ref 14–54)
ANION GAP: 5 (ref 5–15)
AST: 11 U/L — ABNORMAL LOW (ref 15–41)
BUN: 27 mg/dL — ABNORMAL HIGH (ref 6–20)
CHLORIDE: 103 mmol/L (ref 101–111)
CO2: 31 mmol/L (ref 22–32)
Calcium: 10.1 mg/dL (ref 8.9–10.3)
Creatinine, Ser: 0.72 mg/dL (ref 0.44–1.00)
GFR calc Af Amer: >60 mL/min (ref >60)
GFR calc non Af Amer: >60 mL/min (ref >60)
GLUCOSE: 171 mg/dL — AB (ref 65–99)
POTASSIUM: 3.1 mmol/L — AB (ref 3.5–5.1)
SODIUM: 139 mmol/L (ref 135–145)
Total Bilirubin: 0.6 mg/dL (ref 0.3–1.2)
Total Protein: 6.6 g/dL (ref 6.5–8.1)

## 2015-02-24 DIAGNOSIS — R5381 Other malaise: Secondary | ICD-10-CM | POA: Diagnosis not present

## 2015-02-24 LAB — GLUCOSE, CAPILLARY
Glucose-Capillary: 83 mg/dL (ref 65–99)
Glucose-Capillary: 170 mg/dL — ABNORMAL HIGH (ref 65–99)

## 2015-02-25 DIAGNOSIS — R5381 Other malaise: Secondary | ICD-10-CM | POA: Diagnosis not present

## 2015-02-25 LAB — GLUCOSE, CAPILLARY
GLUCOSE-CAPILLARY: 78 mg/dL (ref 65–99)
GLUCOSE-CAPILLARY: 215 mg/dL — AB (ref 65–99)

## 2015-02-26 DIAGNOSIS — R5381 Other malaise: Secondary | ICD-10-CM | POA: Diagnosis not present

## 2015-02-26 LAB — GLUCOSE, CAPILLARY
GLUCOSE-CAPILLARY: 250 mg/dL — AB (ref 65–99)
Glucose-Capillary: 139 mg/dL — ABNORMAL HIGH (ref 65–99)

## 2015-02-27 DIAGNOSIS — R5381 Other malaise: Secondary | ICD-10-CM | POA: Diagnosis not present

## 2015-02-27 LAB — CBC WITH DIFFERENTIAL/PLATELET
BASOS PCT: 0 %
Basophils Absolute: 0 10*3/uL (ref 0–0.1)
EOS ABS: 0.4 10*3/uL (ref 0–0.7)
Eosinophils Relative: 2 %
HEMATOCRIT: 38.8 % (ref 35.0–47.0)
Hemoglobin: 12.7 g/dL (ref 12.0–16.0)
LYMPHS ABS: 4.6 10*3/uL — AB (ref 1.0–3.6)
Lymphocytes Relative: 29 %
MCH: 32.2 pg (ref 26.0–34.0)
MCHC: 32.7 g/dL (ref 32.0–36.0)
MCV: 98.4 fL (ref 80.0–100.0)
Monocytes Absolute: 0.9 10*3/uL (ref 0.2–0.9)
Monocytes Relative: 5 %
NEUTROS ABS: 10.1 10*3/uL — AB (ref 1.4–6.5)
Neutrophils Relative %: 64 %
Platelets: 243 10*3/uL (ref 150–440)
RBC: 3.94 MIL/uL (ref 3.80–5.20)
RDW: 12.9 % (ref 11.5–14.5)
WBC: 16 10*3/uL — ABNORMAL HIGH (ref 3.6–11.0)

## 2015-02-27 LAB — COMPREHENSIVE METABOLIC PANEL
ALBUMIN: 3.1 g/dL — AB (ref 3.5–5.0)
ALT: 9 U/L — ABNORMAL LOW (ref 14–54)
ANION GAP: 10 (ref 5–15)
AST: 10 U/L — ABNORMAL LOW (ref 15–41)
Alkaline Phosphatase: 55 U/L (ref 38–126)
BILIRUBIN TOTAL: 0.5 mg/dL (ref 0.3–1.2)
BUN: 27 mg/dL — ABNORMAL HIGH (ref 6–20)
CALCIUM: 9.4 mg/dL (ref 8.9–10.3)
CO2: 29 mmol/L (ref 22–32)
Chloride: 96 mmol/L — ABNORMAL LOW (ref 101–111)
Creatinine, Ser: 0.84 mg/dL (ref 0.44–1.00)
GFR calc non Af Amer: >60 mL/min (ref >60)
GLUCOSE: 89 mg/dL (ref 65–99)
POTASSIUM: 3.1 mmol/L — AB (ref 3.5–5.1)
Sodium: 135 mmol/L (ref 135–145)
TOTAL PROTEIN: 6.9 g/dL (ref 6.5–8.1)

## 2015-02-27 LAB — GLUCOSE, CAPILLARY
GLUCOSE-CAPILLARY: 84 mg/dL (ref 65–99)
Glucose-Capillary: 101 mg/dL — ABNORMAL HIGH (ref 65–99)

## 2015-02-28 DIAGNOSIS — E876 Hypokalemia: Secondary | ICD-10-CM | POA: Diagnosis not present

## 2015-02-28 DIAGNOSIS — E119 Type 2 diabetes mellitus without complications: Secondary | ICD-10-CM | POA: Diagnosis not present

## 2015-02-28 DIAGNOSIS — R231 Pallor: Secondary | ICD-10-CM | POA: Diagnosis not present

## 2015-02-28 DIAGNOSIS — R5381 Other malaise: Secondary | ICD-10-CM | POA: Diagnosis present

## 2015-02-28 DIAGNOSIS — R410 Disorientation, unspecified: Secondary | ICD-10-CM | POA: Diagnosis not present

## 2015-02-28 LAB — GLUCOSE, CAPILLARY
Glucose-Capillary: 88 mg/dL (ref 65–99)
Glucose-Capillary: 135 mg/dL — ABNORMAL HIGH (ref 65–99)

## 2015-03-01 DIAGNOSIS — R5381 Other malaise: Secondary | ICD-10-CM | POA: Diagnosis not present

## 2015-03-01 LAB — GLUCOSE, CAPILLARY
GLUCOSE-CAPILLARY: 101 mg/dL — AB (ref 65–99)
GLUCOSE-CAPILLARY: 120 mg/dL — AB (ref 65–99)

## 2015-03-02 DIAGNOSIS — R5381 Other malaise: Secondary | ICD-10-CM | POA: Diagnosis not present

## 2015-03-02 LAB — GLUCOSE, CAPILLARY
GLUCOSE-CAPILLARY: 73 mg/dL (ref 65–99)
GLUCOSE-CAPILLARY: 294 mg/dL — AB (ref 65–99)

## 2015-03-03 DIAGNOSIS — R5381 Other malaise: Secondary | ICD-10-CM | POA: Diagnosis not present

## 2015-03-03 LAB — GLUCOSE, CAPILLARY
GLUCOSE-CAPILLARY: 201 mg/dL — AB (ref 65–99)
Glucose-Capillary: 110 mg/dL — ABNORMAL HIGH (ref 65–99)

## 2015-03-04 DIAGNOSIS — R5381 Other malaise: Secondary | ICD-10-CM | POA: Diagnosis not present

## 2015-03-04 LAB — GLUCOSE, CAPILLARY: Glucose-Capillary: 87 mg/dL (ref 65–99)

## 2015-03-05 DIAGNOSIS — R5381 Other malaise: Secondary | ICD-10-CM | POA: Diagnosis not present

## 2015-03-05 LAB — GLUCOSE, CAPILLARY
GLUCOSE-CAPILLARY: 60 mg/dL — AB (ref 65–99)
GLUCOSE-CAPILLARY: 158 mg/dL — AB (ref 65–99)
Glucose-Capillary: 93 mg/dL (ref 65–99)

## 2015-03-06 DIAGNOSIS — R5381 Other malaise: Secondary | ICD-10-CM | POA: Diagnosis not present

## 2015-03-06 LAB — GLUCOSE, CAPILLARY
Glucose-Capillary: 162 mg/dL — ABNORMAL HIGH (ref 65–99)
Glucose-Capillary: 219 mg/dL — ABNORMAL HIGH (ref 65–99)

## 2015-03-07 DIAGNOSIS — R5381 Other malaise: Secondary | ICD-10-CM | POA: Diagnosis not present

## 2015-03-07 LAB — GLUCOSE, CAPILLARY
Glucose-Capillary: 131 mg/dL — ABNORMAL HIGH (ref 65–99)
Glucose-Capillary: 161 mg/dL — ABNORMAL HIGH (ref 65–99)

## 2015-03-08 DIAGNOSIS — R5381 Other malaise: Secondary | ICD-10-CM | POA: Diagnosis not present

## 2015-03-08 LAB — GLUCOSE, CAPILLARY
GLUCOSE-CAPILLARY: 92 mg/dL (ref 65–99)
Glucose-Capillary: 71 mg/dL (ref 65–99)

## 2015-03-09 DIAGNOSIS — R5381 Other malaise: Secondary | ICD-10-CM | POA: Diagnosis not present

## 2015-03-09 LAB — GLUCOSE, CAPILLARY
Glucose-Capillary: 97 mg/dL (ref 65–99)
Glucose-Capillary: 164 mg/dL — ABNORMAL HIGH (ref 65–99)

## 2015-03-10 DIAGNOSIS — R5381 Other malaise: Secondary | ICD-10-CM | POA: Diagnosis not present

## 2015-03-10 LAB — GLUCOSE, CAPILLARY
GLUCOSE-CAPILLARY: 89 mg/dL (ref 65–99)
GLUCOSE-CAPILLARY: 173 mg/dL — AB (ref 65–99)

## 2015-03-11 DIAGNOSIS — R5381 Other malaise: Secondary | ICD-10-CM | POA: Diagnosis not present

## 2015-03-11 LAB — GLUCOSE, CAPILLARY
Glucose-Capillary: 98 mg/dL (ref 65–99)
Glucose-Capillary: 166 mg/dL — ABNORMAL HIGH (ref 65–99)

## 2015-03-12 DIAGNOSIS — R5381 Other malaise: Secondary | ICD-10-CM | POA: Diagnosis not present

## 2015-03-12 LAB — GLUCOSE, CAPILLARY
GLUCOSE-CAPILLARY: 118 mg/dL — AB (ref 65–99)
Glucose-Capillary: 57 mg/dL — ABNORMAL LOW (ref 65–99)
Glucose-Capillary: 127 mg/dL — ABNORMAL HIGH (ref 65–99)

## 2015-03-13 DIAGNOSIS — R5381 Other malaise: Secondary | ICD-10-CM | POA: Diagnosis not present

## 2015-03-13 LAB — GLUCOSE, CAPILLARY
GLUCOSE-CAPILLARY: 105 mg/dL — AB (ref 65–99)
GLUCOSE-CAPILLARY: 109 mg/dL — AB (ref 65–99)

## 2015-03-14 DIAGNOSIS — R5381 Other malaise: Secondary | ICD-10-CM | POA: Diagnosis not present

## 2015-03-14 LAB — GLUCOSE, CAPILLARY
GLUCOSE-CAPILLARY: 91 mg/dL (ref 65–99)
Glucose-Capillary: 213 mg/dL — ABNORMAL HIGH (ref 65–99)

## 2015-03-15 DIAGNOSIS — R5381 Other malaise: Secondary | ICD-10-CM | POA: Diagnosis not present

## 2015-03-15 LAB — GLUCOSE, CAPILLARY
GLUCOSE-CAPILLARY: 123 mg/dL — AB (ref 65–99)
Glucose-Capillary: 169 mg/dL — ABNORMAL HIGH (ref 65–99)

## 2015-03-16 DIAGNOSIS — R5381 Other malaise: Secondary | ICD-10-CM | POA: Diagnosis not present

## 2015-03-16 LAB — GLUCOSE, CAPILLARY
GLUCOSE-CAPILLARY: 94 mg/dL (ref 65–99)
GLUCOSE-CAPILLARY: 68 mg/dL (ref 65–99)

## 2015-03-17 DIAGNOSIS — R5381 Other malaise: Secondary | ICD-10-CM | POA: Diagnosis not present

## 2015-03-17 LAB — GLUCOSE, CAPILLARY
GLUCOSE-CAPILLARY: 168 mg/dL — AB (ref 65–99)
Glucose-Capillary: 99 mg/dL (ref 65–99)

## 2015-03-18 ENCOUNTER — Emergency Department: Payer: Medicare Other

## 2015-03-18 ENCOUNTER — Inpatient Hospital Stay
Admission: EM | Admit: 2015-03-18 | Discharge: 2015-03-20 | DRG: 392 | Disposition: A | Discharge status: Skilled Nursing Facility | Payer: Medicare Other | Attending: Internal Medicine | Admitting: Internal Medicine

## 2015-03-18 ENCOUNTER — Encounter: Payer: Self-pay | Admitting: Emergency Medicine

## 2015-03-18 ENCOUNTER — Encounter
Admission: RE | Admit: 2015-03-18 | Discharge: 2015-03-18 | Disposition: A | Discharge status: Home / Self Care | Payer: Medicare Other | Source: Ambulatory Visit | Attending: Internal Medicine | Admitting: Internal Medicine

## 2015-03-18 DIAGNOSIS — J449 Chronic obstructive pulmonary disease, unspecified: Secondary | ICD-10-CM | POA: Diagnosis present

## 2015-03-18 DIAGNOSIS — E785 Hyperlipidemia, unspecified: Secondary | ICD-10-CM | POA: Diagnosis present

## 2015-03-18 DIAGNOSIS — I2510 Atherosclerotic heart disease of native coronary artery without angina pectoris: Secondary | ICD-10-CM | POA: Diagnosis present

## 2015-03-18 DIAGNOSIS — Z66 Do not resuscitate: Secondary | ICD-10-CM | POA: Diagnosis present

## 2015-03-18 DIAGNOSIS — M359 Systemic involvement of connective tissue, unspecified: Secondary | ICD-10-CM | POA: Diagnosis present

## 2015-03-18 DIAGNOSIS — Z79891 Long term (current) use of opiate analgesic: Secondary | ICD-10-CM | POA: Diagnosis not present

## 2015-03-18 DIAGNOSIS — F0390 Unspecified dementia without behavioral disturbance: Secondary | ICD-10-CM | POA: Diagnosis present

## 2015-03-18 DIAGNOSIS — K6289 Other specified diseases of anus and rectum: Secondary | ICD-10-CM | POA: Diagnosis present

## 2015-03-18 DIAGNOSIS — I10 Essential (primary) hypertension: Secondary | ICD-10-CM | POA: Diagnosis present

## 2015-03-18 DIAGNOSIS — H9190 Unspecified hearing loss, unspecified ear: Secondary | ICD-10-CM | POA: Diagnosis present

## 2015-03-18 DIAGNOSIS — E119 Type 2 diabetes mellitus without complications: Secondary | ICD-10-CM | POA: Diagnosis present

## 2015-03-18 DIAGNOSIS — I480 Paroxysmal atrial fibrillation: Secondary | ICD-10-CM | POA: Diagnosis present

## 2015-03-18 DIAGNOSIS — Z8249 Family history of ischemic heart disease and other diseases of the circulatory system: Secondary | ICD-10-CM

## 2015-03-18 DIAGNOSIS — I4891 Unspecified atrial fibrillation: Secondary | ICD-10-CM | POA: Diagnosis present

## 2015-03-18 DIAGNOSIS — A084 Viral intestinal infection, unspecified: Principal | ICD-10-CM | POA: Diagnosis present

## 2015-03-18 DIAGNOSIS — Z79899 Other long term (current) drug therapy: Secondary | ICD-10-CM

## 2015-03-18 DIAGNOSIS — Z794 Long term (current) use of insulin: Secondary | ICD-10-CM | POA: Diagnosis not present

## 2015-03-18 DIAGNOSIS — E1129 Type 2 diabetes mellitus with other diabetic kidney complication: Secondary | ICD-10-CM | POA: Insufficient documentation

## 2015-03-18 DIAGNOSIS — M419 Scoliosis, unspecified: Secondary | ICD-10-CM | POA: Diagnosis present

## 2015-03-18 DIAGNOSIS — Z833 Family history of diabetes mellitus: Secondary | ICD-10-CM

## 2015-03-18 DIAGNOSIS — R112 Nausea with vomiting, unspecified: Secondary | ICD-10-CM

## 2015-03-18 HISTORY — DX: Atherosclerotic heart disease of native coronary artery without angina pectoris: I25.10

## 2015-03-18 HISTORY — DX: Hyperlipidemia, unspecified: E78.5

## 2015-03-18 HISTORY — DX: Chronic obstructive pulmonary disease, unspecified: J44.9

## 2015-03-18 HISTORY — DX: Disorder of kidney and ureter, unspecified: N28.9

## 2015-03-18 HISTORY — DX: Systemic involvement of connective tissue, unspecified: M35.9

## 2015-03-18 HISTORY — DX: Essential (primary) hypertension: I10

## 2015-03-18 HISTORY — DX: Type 2 diabetes mellitus without complications: E11.9

## 2015-03-18 LAB — URINALYSIS COMPLETE WITH MICROSCOPIC (ARMC ONLY)
BILIRUBIN URINE: NEGATIVE
GLUCOSE, UA: 50 mg/dL — AB
HGB URINE DIPSTICK: NEGATIVE
LEUKOCYTES UA: NEGATIVE
NITRITE: NEGATIVE
Protein, ur: NEGATIVE mg/dL
Specific Gravity, Urine: >1.06 — ABNORMAL HIGH (ref 1.005–1.030)
pH: 6 (ref 5.0–8.0)

## 2015-03-18 LAB — LIPASE, BLOOD: LIPASE: 20 U/L (ref 11–51)

## 2015-03-18 LAB — CBC WITH DIFFERENTIAL/PLATELET
Basophils Absolute: 0.1 10*3/uL (ref 0–0.1)
Basophils Relative: 0 %
EOS PCT: 0 %
Eosinophils Absolute: 0 10*3/uL (ref 0–0.7)
HEMATOCRIT: 39.3 % (ref 35.0–47.0)
Hemoglobin: 12.9 g/dL (ref 12.0–16.0)
LYMPHS PCT: 14 %
Lymphs Abs: 3 10*3/uL (ref 1.0–3.6)
MCH: 31.5 pg (ref 26.0–34.0)
MCHC: 32.9 g/dL (ref 32.0–36.0)
MCV: 95.9 fL (ref 80.0–100.0)
MONO ABS: 0.6 10*3/uL (ref 0.2–0.9)
MONOS PCT: 3 %
NEUTROS ABS: 17.3 10*3/uL — AB (ref 1.4–6.5)
Neutrophils Relative %: 83 %
PLATELETS: 315 10*3/uL (ref 150–440)
RBC: 4.1 MIL/uL (ref 3.80–5.20)
RDW: 13.5 % (ref 11.5–14.5)
WBC: 21 10*3/uL — ABNORMAL HIGH (ref 3.6–11.0)

## 2015-03-18 LAB — GLUCOSE, CAPILLARY
GLUCOSE-CAPILLARY: 131 mg/dL — AB (ref 65–99)
Glucose-Capillary: 189 mg/dL — ABNORMAL HIGH (ref 65–99)
Glucose-Capillary: 186 mg/dL — ABNORMAL HIGH (ref 65–99)

## 2015-03-18 LAB — COMPREHENSIVE METABOLIC PANEL
ALT: 9 U/L — ABNORMAL LOW (ref 14–54)
ANION GAP: 14 (ref 5–15)
AST: 11 U/L — ABNORMAL LOW (ref 15–41)
Albumin: 3 g/dL — ABNORMAL LOW (ref 3.5–5.0)
Alkaline Phosphatase: 60 U/L (ref 38–126)
BILIRUBIN TOTAL: 1.5 mg/dL — AB (ref 0.3–1.2)
BUN: 14 mg/dL (ref 6–20)
CO2: 20 mmol/L — ABNORMAL LOW (ref 22–32)
Calcium: 8.5 mg/dL — ABNORMAL LOW (ref 8.9–10.3)
Chloride: 105 mmol/L (ref 101–111)
Creatinine, Ser: 0.79 mg/dL (ref 0.44–1.00)
Glucose, Bld: 226 mg/dL — ABNORMAL HIGH (ref 65–99)
POTASSIUM: 3.9 mmol/L (ref 3.5–5.1)
Sodium: 139 mmol/L (ref 135–145)
TOTAL PROTEIN: 6.9 g/dL (ref 6.5–8.1)

## 2015-03-18 MED ORDER — SODIUM CHLORIDE 0.9 % IV SOLN
INTRAVENOUS | Status: DC
  Administered 2015-03-18 – 2015-03-19 (×2): via INTRAVENOUS

## 2015-03-18 MED ORDER — MORPHINE SULFATE (PF) 2 MG/ML IV SOLN: 1.0000 mg | INTRAVENOUS | Status: DC | PRN

## 2015-03-18 MED ORDER — TIOTROPIUM BROMIDE MONOHYDRATE 18 MCG IN CAPS
18.0000 ug | ORAL_CAPSULE | Freq: Every day | RESPIRATORY_TRACT | Status: DC
  Administered 2015-03-18 – 2015-03-20 (×3): 18 ug via RESPIRATORY_TRACT
  Filled 2015-03-18: qty 5

## 2015-03-18 MED ORDER — ONDANSETRON HCL 4 MG/2ML IJ SOLN
4.0000 mg | Freq: Once | INTRAMUSCULAR | Status: AC
  Administered 2015-03-18: 4 mg via INTRAVENOUS
  Filled 2015-03-18: qty 2

## 2015-03-18 MED ORDER — SALINE SPRAY 0.65 % NA SOLN
2.0000 | NASAL | Status: DC | PRN
  Filled 2015-03-18: qty 44

## 2015-03-18 MED ORDER — METOPROLOL TARTRATE 1 MG/ML IV SOLN
5.0000 mg | INTRAVENOUS | Status: DC | PRN
  Administered 2015-03-18 (×2): 5 mg via INTRAVENOUS
  Filled 2015-03-18 (×2): qty 5

## 2015-03-18 MED ORDER — IOHEXOL 300 MG/ML  SOLN
100.0000 mL | Freq: Once | INTRAMUSCULAR | Status: AC | PRN
  Administered 2015-03-18: 100 mL via INTRAVENOUS

## 2015-03-18 MED ORDER — CAPSAICIN 0.025 % EX CREA
TOPICAL_CREAM | Freq: Four times a day (QID) | CUTANEOUS | Status: DC
  Administered 2015-03-18 – 2015-03-20 (×5): via TOPICAL
  Filled 2015-03-18: qty 56.6

## 2015-03-18 MED ORDER — INSULIN ASPART 100 UNIT/ML ~~LOC~~ SOLN
0.0000 [IU] | Freq: Three times a day (TID) | SUBCUTANEOUS | Status: DC
  Administered 2015-03-18 (×2): 2 [IU] via SUBCUTANEOUS
  Administered 2015-03-19 (×2): 1 [IU] via SUBCUTANEOUS
  Administered 2015-03-20 (×2): 3 [IU] via SUBCUTANEOUS
  Filled 2015-03-18: qty 3
  Filled 2015-03-18: qty 2
  Filled 2015-03-18: qty 3
  Filled 2015-03-18: qty 2
  Filled 2015-03-18 (×2): qty 1

## 2015-03-18 MED ORDER — PIPERACILLIN-TAZOBACTAM 3.375 G IVPB 30 MIN: 3.3750 g | Freq: Three times a day (TID) | INTRAVENOUS | Status: DC

## 2015-03-18 MED ORDER — SODIUM CHLORIDE 0.9% FLUSH
3.0000 mL | Freq: Two times a day (BID) | INTRAVENOUS | Status: DC
  Administered 2015-03-19 – 2015-03-20 (×2): 3 mL via INTRAVENOUS

## 2015-03-18 MED ORDER — BUDESONIDE-FORMOTEROL FUMARATE 160-4.5 MCG/ACT IN AERO
2.0000 | INHALATION_SPRAY | Freq: Two times a day (BID) | RESPIRATORY_TRACT | Status: DC
  Administered 2015-03-18 – 2015-03-20 (×4): 2 via RESPIRATORY_TRACT
  Filled 2015-03-18: qty 6

## 2015-03-18 MED ORDER — FLUTICASONE PROPIONATE 50 MCG/ACT NA SUSP
2.0000 | Freq: Every day | NASAL | Status: DC
  Administered 2015-03-18 – 2015-03-19 (×2): 2 via NASAL
  Filled 2015-03-18: qty 16

## 2015-03-18 MED ORDER — TROLAMINE SALICYLATE 10 % EX CREA
1.0000 | TOPICAL_CREAM | Freq: Four times a day (QID) | CUTANEOUS | Status: DC
Start: 2015-03-18 — End: 2015-03-18
  Filled 2015-03-18: qty 85

## 2015-03-18 MED ORDER — PIPERACILLIN-TAZOBACTAM 3.375 G IVPB
3.3750 g | Freq: Three times a day (TID) | INTRAVENOUS | Status: DC
  Administered 2015-03-19 – 2015-03-20 (×5): 3.375 g via INTRAVENOUS
  Filled 2015-03-18 (×9): qty 50

## 2015-03-18 MED ORDER — OXYCODONE HCL 5 MG PO TABS
5.0000 mg | ORAL_TABLET | ORAL | Status: DC | PRN
  Administered 2015-03-19 (×2): 5 mg via ORAL
  Filled 2015-03-18 (×2): qty 1

## 2015-03-18 MED ORDER — RIVAROXABAN 20 MG PO TABS
20.0000 mg | ORAL_TABLET | Freq: Every day | ORAL | Status: DC
  Administered 2015-03-18 – 2015-03-19 (×2): 20 mg via ORAL
  Filled 2015-03-18 (×2): qty 1

## 2015-03-18 MED ORDER — ACETAMINOPHEN 325 MG PO TABS
650.0000 mg | ORAL_TABLET | Freq: Four times a day (QID) | ORAL | Status: DC | PRN
Start: 2015-03-18 — End: 2015-03-20

## 2015-03-18 MED ORDER — ONDANSETRON HCL 4 MG PO TABS: 4.0000 mg | ORAL_TABLET | Freq: Four times a day (QID) | ORAL | Status: DC | PRN

## 2015-03-18 MED ORDER — PANTOPRAZOLE SODIUM 40 MG IV SOLR
40.0000 mg | Freq: Two times a day (BID) | INTRAVENOUS | Status: DC
  Administered 2015-03-18 – 2015-03-19 (×3): 40 mg via INTRAVENOUS
  Filled 2015-03-18 (×3): qty 40

## 2015-03-18 MED ORDER — METOPROLOL TARTRATE 1 MG/ML IV SOLN: 5.0000 mg | INTRAVENOUS | Status: DC | PRN

## 2015-03-18 MED ORDER — MORPHINE SULFATE (PF) 2 MG/ML IV SOLN
2.0000 mg | Freq: Once | INTRAVENOUS | Status: DC
  Filled 2015-03-18: qty 1

## 2015-03-18 MED ORDER — PIPERACILLIN-TAZOBACTAM 3.375 G IVPB
3.3750 g | Freq: Once | INTRAVENOUS | Status: AC
  Administered 2015-03-18: 3.375 g via INTRAVENOUS
  Filled 2015-03-18: qty 50

## 2015-03-18 MED ORDER — SODIUM CHLORIDE 0.9 % IV SOLN
1000.0000 mL | Freq: Once | INTRAVENOUS | Status: AC
  Administered 2015-03-18: 1000 mL via INTRAVENOUS

## 2015-03-18 MED ORDER — ACETAMINOPHEN 650 MG RE SUPP
650.0000 mg | Freq: Four times a day (QID) | RECTAL | Status: DC | PRN
Start: 2015-03-18 — End: 2015-03-20

## 2015-03-18 MED ORDER — INSULIN ASPART 100 UNIT/ML ~~LOC~~ SOLN: 0.0000 [IU] | Freq: Every day | SUBCUTANEOUS | Status: DC

## 2015-03-18 MED ORDER — ONDANSETRON HCL 4 MG/2ML IJ SOLN: 4.0000 mg | Freq: Four times a day (QID) | INTRAMUSCULAR | Status: DC | PRN

## 2015-03-18 NOTE — Progress Notes (Signed)
Called report to nurse on 2A about patient transfer to room 243

## 2015-03-18 NOTE — NC FL2 (Signed)
[place] MEDICAID FL2 LEVEL OF CARE SCREENING TOOL     IDENTIFICATION  Patient Name: [name] Birthdate: September 20, 1925 Sex: female Admission Date (Current Location): 03/18/2015  [place] and [place] Number:  Chiropodist and Address:  [facility], [address] [place], [place] [address]      Provider Number: [id number]  Attending Physician Name and Address:  Ramonita Lab, MD  Relative Name and Phone Number:       Current Level of Care: SNF Recommended Level of Care: Skilled Nursing Facility Prior Approval Number:    Date Approved/Denied:   PASRR Number:  [id number]  Discharge Plan: SNF    Current Diagnoses: Patient Active Problem List   Diagnosis Date Noted  . Proctitis 03/18/2015    Orientation RESPIRATION BLADDER Height & Weight     Self, Situation, Place    Normal   Incontinent Weight: 191 lb 8 oz (86.864 kg) Height:   (160 cm)  BEHAVIORAL SYMPTOMS/MOOD NEUROLOGICAL BOWEL NUTRITION STATUS   None    Self and Place   Incontinent    AMBULATORY STATUS COMMUNICATION OF NEEDS Skin   Total Care Verbally Skin abrasions, Bruising                       Personal Care Assistance Level of Assistance  Bathing, Feeding, Dressing, Total care Bathing Assistance: Maximum assistance Feeding assistance: Maximum assistance Dressing Assistance: Maximum assistance Total Care Assistance: Maximum assistance   Functional Limitations Info  Sight, Hearing, Speech Sight Info: Adequate Hearing Info: Impaired (lost her hearing aid) Speech Info: Adequate    SPECIAL CARE FACTORS FREQUENCY                       Contractures      Additional Factors Info  Code Status Code Status Info: DNR    Contact precautions- Enteric precautions (UV disinfection) and MRSA           Current Medications (03/18/2015):  This is the current hospital active medication list Current Facility-Administered Medications  Medication Dose Route  Frequency Provider Last Rate Last Dose  . insulin aspart (novoLOG) injection 0-5 Units  0-5 Units Subcutaneous QHS Aruna Gouru, MD      . insulin aspart (novoLOG) injection 0-9 Units  0-9 Units Subcutaneous TID WC Ramonita Lab, MD   2 Units at 03/18/15 1147  . metoprolol (LOPRESSOR) injection 5 mg  5 mg Intravenous Q4H PRN Ramonita Lab, MD      . morphine 2 MG/ML injection 2 mg  2 mg Intravenous Once Emily Filbert, MD   2 mg at 03/18/15 0843  . pantoprazole (PROTONIX) injection 40 mg  40 mg Intravenous Q12H Ramonita Lab, MD   40 mg at 03/18/15 1156   Current Outpatient Prescriptions  Medication Sig Dispense Refill  . acetaminophen (TYLENOL) 325 MG tablet Take 650 mg by mouth every 8 (eight) hours.    . budesonide-formoterol (SYMBICORT) 160-4.5 MCG/ACT inhaler Inhale 2 puffs into the lungs every 12 (twelve) hours.    . calcium carbonate (OSCAL) 1500 (600 Ca) MG TABS tablet Take 1,500 mg by mouth 2 (two) times daily with a meal.    . cholecalciferol (VITAMIN D) 1000 units tablet Take 2,000 Units by mouth daily.    . cyanocobalamin (,VITAMIN B-12,) 1000 MCG/ML injection Inject 1,000 mcg into the muscle every 28 (twenty-eight) days.    Marland Kitchen dicyclomine (BENTYL) 20 MG tablet Take 20 mg by mouth 3 (three)  times daily.    . DULoxetine (CYMBALTA) 60 MG capsule Take 60 mg by mouth daily.    . fluticasone (FLONASE) 50 MCG/ACT nasal spray Place 2 sprays into both nostrils at bedtime.    Marland Kitchen glipiZIDE (GLUCOTROL) 5 MG tablet Take 5 mg by mouth 2 (two) times daily.    Marland Kitchen guaiFENesin (ROBITUSSIN) 100 MG/5ML liquid Take 200 mg by mouth every 4 (four) hours as needed for cough.    . insulin NPH Human (HUMULIN N,NOVOLIN N) 100 UNIT/ML injection Inject 10-22 Units into the skin 2 (two) times daily. Give 22 units every morning and 10 units in the evening.    . insulin regular (NOVOLIN R,HUMULIN R) 100 units/mL injection Inject 3-8 Units into the skin as needed for high blood sugar. Per sliding scale: If blood sugar  251-325 give 3 units, 326-450 give 6 units, >450 give 8 units    . loperamide (IMODIUM) 2 MG capsule Take 2-4 mg by mouth as needed for diarrhea or loose stools. Reported on 03/18/2015    . LORazepam (ATIVAN) 0.5 MG tablet Take 0.5 mg by mouth every 4 (four) hours as needed for anxiety.    . metoCLOPramide (REGLAN) 5 MG tablet Take 5 mg by mouth 3 (three) times daily.    . metoprolol tartrate (LOPRESSOR) 25 MG tablet Take 25 mg by mouth 2 (two) times daily.    . montelukast (SINGULAIR) 10 MG tablet Take 10 mg by mouth at bedtime.    . pantoprazole (PROTONIX) 40 MG tablet Take 40 mg by mouth 2 (two) times daily.    . phenylephrine-shark liver oil-mineral oil-petrolatum (PREPARATION H) 0.25-14-74.9 % rectal ointment Place 1 application rectally as needed for hemorrhoids.    . potassium chloride (KLOR-CON) 20 MEQ packet Take 20 mEq by mouth daily.    . pravastatin (PRAVACHOL) 20 MG tablet Take 20 mg by mouth at bedtime.    . promethazine (PHENERGAN) 25 MG tablet Take 25 mg by mouth every 4 (four) hours as needed for nausea or vomiting.    . rivaroxaban (XARELTO) 20 MG TABS tablet Take 20 mg by mouth daily.    Marland Kitchen scopolamine (TRANSDERM-SCOP) 1 MG/3DAYS Place 1 patch onto the skin every 3 (three) days.    Marland Kitchen senna-docusate (SENOKOT-S) 8.6-50 MG tablet Take 1 tablet by mouth 2 (two) times daily.    . sodium chloride (OCEAN) 0.65 % SOLN nasal spray Place 2 sprays into both nostrils as needed for congestion.    Marland Kitchen tiotropium (SPIRIVA) 18 MCG inhalation capsule Place 18 mcg into inhaler and inhale daily.    Marland Kitchen torsemide (DEMADEX) 10 MG tablet Take 15 mg by mouth daily.    . traMADol (ULTRAM) 50 MG tablet Take 50 mg by mouth every 4 (four) hours as needed.    . traMADol (ULTRAM) 50 MG tablet Take 50 mg by mouth 4 (four) times daily. OK to use additional PRN doses if needed.    . trolamine salicylate (ASPERCREME) 10 % cream Apply 1 application topically 4 (four) times daily.    Marland Kitchen witch hazel-glycerin (TUCKS) pad  Apply 1-2 application topically as needed for itching.       Discharge Medications: Please see discharge summary for a list of discharge medications.  Relevant Imaging Results:  Relevant Lab Results:   Additional Information   [id number]  Cheron Schaumann, [place]

## 2015-03-18 NOTE — Progress Notes (Signed)
Called Nursing supervisor and a rapid response for HR in the 150's to 160.

## 2015-03-18 NOTE — ED Notes (Signed)
Pt noted to have 2 episodes of vomiting while being changed. Vomit noted to be clear in color with brown streaks at this time. Pt able to clear secretions without assistance. Pt sitting up in bed at this time. Pt given clean brief at this time.

## 2015-03-18 NOTE — ED Notes (Signed)
Pt arrived via EMS from [facility] for complaints of nausea for the past two days. EMS reports VSS. Pt alert and oriented.

## 2015-03-18 NOTE — Progress Notes (Signed)
°   03/18/15 1900  Clinical Encounter Type  Visited With Health care provider  Visit Type Initial  Referral From Nurse  Consult/Referral To Chaplain  Responded to request for consult/prayer.  Staff member in patient's room asked that I return later due to medical event. Chaplain [employer] Ext [phone]

## 2015-03-18 NOTE — ED Provider Notes (Signed)
[facility] Emergency Department Provider Note     Time seen: ----------------------------------------- 7:37 AM on 03/18/2015 -----------------------------------------    I have reviewed the triage vital signs and the nursing notes.   HISTORY  Chief Complaint Nausea    HPI [name] is a 82 y.o. female who presents ER from the nursing home for complaints of nausea and vomiting for 2-3 days. Patient has had nausea, doesn't describe specific abdominal pain, denies diarrhea. Patient presents to the hospital and dry heaving and has been doing so enroute according to EMS. Patient states this is not been a problem for her before, nothing makes her symptoms better so far.   Past Medical History  Diagnosis Date  . Diabetes mellitus without complication (HCC)   . Hypertension   . COPD (chronic obstructive pulmonary disease) (HCC)   . Coronary artery disease   . Hyperlipidemia   . Renal disorder     There are no active problems to display for this patient.   Past Surgical History  Procedure Laterality Date  . Cesarean section      Allergies Review of patient's allergies indicates no known allergies.  Social History Social History  Substance Use Topics  . Smoking status: Never Smoker   . Smokeless tobacco: Never Used  . Alcohol Use: No    Review of Systems Constitutional: Negative for fever. Eyes: Negative for visual changes. ENT: Negative for sore throat. Cardiovascular: Negative for chest pain. Respiratory: Negative for shortness of breath. Gastrointestinal: Positive for abdominal pain and vomiting Genitourinary: Negative for dysuria. Musculoskeletal: Negative for back pain. Skin: Negative for rash. Neurological: Negative for headaches, focal weakness or numbness.  10-point ROS otherwise negative.  ____________________________________________   PHYSICAL EXAM:  VITAL SIGNS: ED Triage Vitals  Enc Vitals Group     BP 03/18/15  0732 148/75 mmHg     Pulse Rate 03/18/15 0732 110     Resp 03/18/15 0732 18     Temp 03/18/15 0732 98.6 F (37 C)     Temp Source 03/18/15 0732 Oral     SpO2 03/18/15 0732 94 %     Weight 03/18/15 0732 191 lb 8 oz (86.864 kg)     Height 03/18/15 0732  (1.6 m)     Head Cir --      Peak Flow --      Pain Score 03/18/15 0733 6     Pain Loc --      Pain Edu? --      Excl. in GC? --     Constitutional: Alert and oriented. Obese, mild distress Eyes: Conjunctivae are normal. PERRL. Normal extraocular movements. ENT   Head: Normocephalic and atraumatic.   Nose: No congestion/rhinnorhea.   Mouth/Throat: Mucous membranes are moist.   Neck: No stridor. Cardiovascular: Rapid rate, regular rhythm. Normal and symmetric distal pulses are present in all extremities. No murmurs, rubs, or gallops. Respiratory: Normal respiratory effort without tachypnea nor retractions. Breath sounds are clear and equal bilaterally. No wheezes/rales/rhonchi. Gastrointestinal: Nonfocal tenderness, normal bowel sounds. Musculoskeletal: Nontender with normal range of motion in all extremities. No lower extremity tenderness nor edema. Neurologic:  Normal speech and language. No gross focal neurologic deficits are appreciated. Patient is very hard of hearing Skin:  Skin is warm, dry and intact. No rash noted. ____________________________________________  ED COURSE:  Pertinent labs & imaging results that were available during my care of the patient were reviewed by me and considered in my medical decision making (see chart for  details). Patient is in mild distress from active vomiting. She received fluids and antiemetics. ____________________________________________    LABS (pertinent positives/negatives)  Labs Reviewed  GLUCOSE, CAPILLARY - Abnormal; Notable for the following:    Glucose-Capillary 189 (*)    All other components within normal limits  CBC WITH DIFFERENTIAL/PLATELET - Abnormal;  Notable for the following:    WBC 21.0 (*)    Neutro Abs 17.3 (*)    All other components within normal limits  COMPREHENSIVE METABOLIC PANEL - Abnormal; Notable for the following:    CO2 20 (*)    Glucose, Bld 226 (*)    Calcium 8.5 (*)    Albumin 3.0 (*)    AST 11 (*)    ALT 9 (*)    Total Bilirubin 1.5 (*)    All other components within normal limits  LIPASE, BLOOD  URINALYSIS COMPLETEWITH MICROSCOPIC ([facility] ONLY)    RADIOLOGY Images were viewed by me  Abdomen 2 view  IMPRESSION: There is normal small bowel gas pattern. Degenerative changes and mild levoscoliosis lumbar spine. Moderate stool noted in right colon and descending colon. Some colonic gas noted in sigmoid colon. 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  IMPRESSION: Findings most consistent with proctitis as described above. Negative for abscess or CT signs of bowel ischemia.  Large gallstone versus gallbladder wall calcification, unchanged.  Atherosclerosis. ____________________________________________  FINAL ASSESSMENT AND PLAN  Vomiting, proctitis  Plan: Patient with labs and imaging as dictated above. Patient with a high white count and evidence of proctitis is seen by CT scan. I will order Zosyn for her, continue IV fluids and antiemetics and recommend observation the hospital.   Emily Filbert, MD   Emily Filbert, MD 03/18/15 [phone]

## 2015-03-18 NOTE — Clinical Social Work Note (Signed)
Clinical Social Work Assessment  Patient Details  Name: [name] MRN: [id number] Date of Birth: Jul 14, 1925  Date of referral:  03/18/15               Reason for consult:  Family Concerns                Permission sought to share information with:  Family Supports, Magazine features editor Permission granted to share information::  Yes, Verbal Permission Granted  Name::     Lucky Cowboy [phone]  Cecilie Kicks [phone] both HCPOA  Agency::  Yes  Relationship::  Yes  Contact Information:  Yes  Housing/Transportation Living arrangements for the past 2 months:  Skilled Nursing Facility Source of Information:  Adult Children Patient Interpreter Needed:  None Criminal Activity/Legal Involvement Pertinent to Current Situation/Hospitalization:  No - Comment as needed Significant Relationships:  Adult Children, Friend, [employer] Lives with:  Facility Resident Do you feel safe going back to the place where you live?  Yes Need for family participation in patient care:  Yes (Comment)  Care giving concerns:  Patients daughter disclosed she has recently lost her son, daughter in law and her dad 5 years ago. She reported she is stressed out and is a constant care giver to her Mom.   Social Worker assessment / plan:  Completed assessment and Fl2 started, patient can return to [employer]  Employment status:  Retired Health and safety inspector:  Armed forces operational officer, Medicaid In [employer] PT Recommendations:  Skilled Nursing Facility Information / Referral to community resources:  Support Groups  Patient/Family's Response to care:  Daughter very tires and stressed out but glad she is getting hospital care right now.   Patient/Family's Understanding of and Emotional Response to Diagnosis, Current Treatment, and Prognosis:  Good Go Emotional Assessment Appearance:  Appears stated age Attitude/Demeanor/Rapport:   Good Affect (typically observed):  Calm,  Pleasant, Quiet, Blunt, Accepting, Appropriate Orientation:  Oriented to Situation, Oriented to Place, Oriented to Self, Fluctuating Orientation (Suspected and/or reported Sundowners) Alcohol / Substance use:  Never Used Psych involvement (Current and /or in the community):  No (Comment)  Discharge Needs  Concerns to be addressed:  No discharge needs identified Readmission within the last 30 days:  No Current discharge risk:  None Barriers to Discharge:  No Barriers Identified   Cheron Schaumann, LCSW 03/18/2015, 3:00 PM

## 2015-03-18 NOTE — Significant Event (Signed)
Rapid Response Event Note  Overview:  Rapid Response called for heart rate at 167.     Initial Focused Assessment: Pt A&O. Calm and relaxed. BP within normal limits. Pt in A-FIB with heart rate in the 160's.   Interventions: EKG done. Dr. Amado Coe ordered Metoprolol  IV push  Event Summary:   03/18/15 at  1840    [name] E 6:54 PM 03/18/2015         [name] E

## 2015-03-18 NOTE — ED Notes (Signed)
Report given to Sonjia, RN 

## 2015-03-18 NOTE — Progress Notes (Signed)
[name] met with patient and daughter and completed assessment and Fl2 started. Patient was polite and oriented to place and person just a little off in her time frames.   Daughter [name] ( She reported she has lost her son, daughter in-law recently and father 5 years. She is supported by psychiatrist in the community and takes nerve pills. She will go home and rest tonight and may even get her hair cut. She was asked about self care goals to be met and those listed is what she aims to accomplish.  She is able to return to [facility]

## 2015-03-18 NOTE — H&P (Signed)
[facility] - [place] at [facility]   PATIENT NAME: [name]    MR#:  [id number]  DATE OF BIRTH:  07-Jun-1925  DATE OF ADMISSION:  03/18/2015  PRIMARY CARE PHYSICIAN: Lauro Regulus., MD   REQUESTING/REFERRING PHYSICIAN: Dr. Mayford Knife  CHIEF COMPLAINT:   Nausea and vomiting for 3 days HISTORY OF PRESENT ILLNESS:  [name]  is a 82 y.o. female with a known history of diabetic mellitus, hypertension, coronary artery disease, hyperlipidemia and COPD and other medical problems is presenting to the ED with a chief complaint of 2-3 day history of intractable nausea and vomiting. Patient did not complaint any abdominal pain. Poor historian as she has dementia and very hard of hearing. CAT scan of the abdomen has revealed proctitis and patient is started on IV Zosyn in the emergency department. Patient is resting comfortably and denying any abdominal pain during my examination. Had 2 episodes of vomiting in the ED  PAST MEDICAL HISTORY:   Past Medical History  Diagnosis Date  . Diabetes mellitus without complication (HCC)   . Hypertension   . COPD (chronic obstructive pulmonary disease) (HCC)   . Coronary artery disease   . Hyperlipidemia   . Renal disorder   . Collagen vascular disease (HCC)     PAST SURGICAL HISTOIRY:   Past Surgical History  Procedure Laterality Date  . Cesarean section      SOCIAL HISTORY:   Social History  Substance Use Topics  . Smoking status: Never Smoker   . Smokeless tobacco: Never Used  . Alcohol Use: No    FAMILY HISTORY:  Diabetes mellitus and hypertension runs in her family  DRUG ALLERGIES:  No Known Allergies  REVIEW OF SYSTEMS:  Review of system is limited because of the underlying dementia  CONSTITUTIONAL: No fever, fatigue or weakness.  RESPIRATORY: No cough, shortness of breath, wheezing or hemoptysis.  CARDIOVASCULAR: No chest pain, orthopnea, edema.  GASTROINTESTINAL: Reports nausea,  vomiting, denies diarrhea or abdominal pain.  GENITOURINARY: No dysuria, hematuria.     MEDICATIONS AT HOME:   Prior to Admission medications   Medication Sig Start Date End Date Taking? Authorizing Provider  acetaminophen (TYLENOL) 325 MG tablet Take 650 mg by mouth every 8 (eight) hours.   Yes Historical Provider, MD  budesonide-formoterol (SYMBICORT) 160-4.5 MCG/ACT inhaler Inhale 2 puffs into the lungs every 12 (twelve) hours.   Yes Historical Provider, MD  calcium carbonate (OSCAL) 1500 (600 Ca) MG TABS tablet Take 1,500 mg by mouth 2 (two) times daily with a meal.   Yes Historical Provider, MD  cholecalciferol (VITAMIN D) 1000 units tablet Take 2,000 Units by mouth daily.   Yes Historical Provider, MD  cyanocobalamin (,VITAMIN B-12,) 1000 MCG/ML injection Inject 1,000 mcg into the muscle every 28 (twenty-eight) days.   Yes Historical Provider, MD  dicyclomine (BENTYL) 20 MG tablet Take 20 mg by mouth 3 (three) times daily.   Yes Historical Provider, MD  DULoxetine (CYMBALTA) 60 MG capsule Take 60 mg by mouth daily.   Yes Historical Provider, MD  fluticasone (FLONASE) 50 MCG/ACT nasal spray Place 2 sprays into both nostrils at bedtime.   Yes Historical Provider, MD  glipiZIDE (GLUCOTROL) 5 MG tablet Take 5 mg by mouth 2 (two) times daily.   Yes Historical Provider, MD  guaiFENesin (ROBITUSSIN) 100 MG/5ML liquid Take 200 mg by mouth every 4 (four) hours as needed for cough.   Yes Historical Provider, MD  insulin NPH Human (HUMULIN N,NOVOLIN N) 100 UNIT/ML injection Inject  10-22 Units into the skin 2 (two) times daily. Give 22 units every morning and 10 units in the evening.   Yes Historical Provider, MD  insulin regular (NOVOLIN R,HUMULIN R) 100 units/mL injection Inject 3-8 Units into the skin as needed for high blood sugar. Per sliding scale: If blood sugar 251-325 give 3 units, 326-450 give 6 units, >450 give 8 units   Yes Historical Provider, MD  loperamide (IMODIUM) 2 MG capsule Take  2-4 mg by mouth as needed for diarrhea or loose stools. Reported on 03/18/2015   Yes Historical Provider, MD  LORazepam (ATIVAN) 0.5 MG tablet Take 0.5 mg by mouth every 4 (four) hours as needed for anxiety.   Yes Historical Provider, MD  metoCLOPramide (REGLAN) 5 MG tablet Take 5 mg by mouth 3 (three) times daily.   Yes Historical Provider, MD  metoprolol tartrate (LOPRESSOR) 25 MG tablet Take 25 mg by mouth 2 (two) times daily.   Yes Historical Provider, MD  montelukast (SINGULAIR) 10 MG tablet Take 10 mg by mouth at bedtime.   Yes Historical Provider, MD  pantoprazole (PROTONIX) 40 MG tablet Take 40 mg by mouth 2 (two) times daily.   Yes Historical Provider, MD  phenylephrine-shark liver oil-mineral oil-petrolatum (PREPARATION H) 0.25-14-74.9 % rectal ointment Place 1 application rectally as needed for hemorrhoids.   Yes Historical Provider, MD  potassium chloride (KLOR-CON) 20 MEQ packet Take 20 mEq by mouth daily.   Yes Historical Provider, MD  pravastatin (PRAVACHOL) 20 MG tablet Take 20 mg by mouth at bedtime.   Yes Historical Provider, MD  promethazine (PHENERGAN) 25 MG tablet Take 25 mg by mouth every 4 (four) hours as needed for nausea or vomiting.   Yes Historical Provider, MD  rivaroxaban (XARELTO) 20 MG TABS tablet Take 20 mg by mouth daily.   Yes Historical Provider, MD  scopolamine (TRANSDERM-SCOP) 1 MG/3DAYS Place 1 patch onto the skin every 3 (three) days.   Yes Historical Provider, MD  senna-docusate (SENOKOT-S) 8.6-50 MG tablet Take 1 tablet by mouth 2 (two) times daily.   Yes Historical Provider, MD  sodium chloride (OCEAN) 0.65 % SOLN nasal spray Place 2 sprays into both nostrils as needed for congestion.   Yes Historical Provider, MD  tiotropium (SPIRIVA) 18 MCG inhalation capsule Place 18 mcg into inhaler and inhale daily.   Yes Historical Provider, MD  torsemide (DEMADEX) 10 MG tablet Take 15 mg by mouth daily.   Yes Historical Provider, MD  traMADol (ULTRAM) 50 MG tablet Take  50 mg by mouth every 4 (four) hours as needed.   Yes Historical Provider, MD  traMADol (ULTRAM) 50 MG tablet Take 50 mg by mouth 4 (four) times daily. OK to use additional PRN doses if needed.   Yes Historical Provider, MD  trolamine salicylate (ASPERCREME) 10 % cream Apply 1 application topically 4 (four) times daily.   Yes Historical Provider, MD  witch hazel-glycerin (TUCKS) pad Apply 1-2 application topically as needed for itching.   Yes Historical Provider, MD      VITAL SIGNS:  Blood pressure 140/88, pulse 112, temperature 98.6 F (37 C), temperature source Oral, resp. rate 17, height  (1.6 m), weight 86.864 kg (191 lb 8 oz), SpO2 95 %.  PHYSICAL EXAMINATION:  GENERAL:  82 y.o.-year-old patient lying in the bed with no acute distress.  EYES: Pupils equal, round, reactive to light and accommodation. No scleral icterus. Extraocular muscles intact.  HEENT: Head atraumatic, normocephalic. Oropharynx and nasopharynx clear.  NECK:  Supple,  no jugular venous distention. No thyroid enlargement, no tenderness.  LUNGS: Normal breath sounds bilaterally, no wheezing, rales,rhonchi or crepitation. No use of accessory muscles of respiration.  CARDIOVASCULAR: S1, S2 normal. No murmurs, rubs, or gallops.  ABDOMEN: Soft, nontender, nondistended. Bowel sounds present. No organomegaly or mass.  EXTREMITIES: No pedal edema, cyanosis, or clubbing.  NEUROLOGIC: Cranial nerves II through XII are grossly intact. Muscle strength 5/5 in all extremities. Sensation intact. Gait not checked.  PSYCHIATRIC: The patient is alert and oriented x 3.  SKIN: No obvious rash, lesion, or ulcer. Actinic keratosis  LABORATORY PANEL:   CBC  Recent Labs Lab 03/18/15 0820  WBC 21.0*  HGB 12.9  HCT 39.3  PLT 315   ------------------------------------------------------------------------------------------------------------------  Chemistries   Recent Labs Lab 03/18/15 0820  NA 139  K 3.9  CL 105  CO2 20*   GLUCOSE 226*  BUN 14  CREATININE 0.79  CALCIUM 8.5*  AST 11*  ALT 9*  ALKPHOS 60  BILITOT 1.5*   ------------------------------------------------------------------------------------------------------------------  Cardiac Enzymes No results for input(s): TROPONINI in the last 168 hours. ------------------------------------------------------------------------------------------------------------------  RADIOLOGY:  Ct Abdomen Pelvis W Contrast  03/18/2015  CLINICAL DATA:  Nausea for the past 2 days.  Initial encounter. EXAM: CT ABDOMEN AND PELVIS WITH CONTRAST TECHNIQUE: Multidetector CT imaging of the abdomen and pelvis was performed using the standard protocol following bolus administration of intravenous contrast. CONTRAST:  100 mL OMNIPAQUE IOHEXOL 300 MG/ML  SOLN COMPARISON:  CT abdomen and pelvis 05/16/2012. FINDINGS: The lung bases are clear. No pleural or pericardial effusion. There is cardiomegaly. Calcification of the wall fundus of the gallbladder is unchanged. No evidence of cholecystitis is identified. The liver, spleen, adrenal glands, pancreas and right kidney are unremarkable. Left renal cyst is unchanged. The There is thickening of the walls of the distal sigmoid colon and rectum with surrounding mild stranding. No diverticular disease identified. Somewhat prominent stool burden in the rectosigmoid colon is noted. The colon is otherwise unremarkable. The stomach and small bowel appear normal. No pneumatosis, portal venous gas or free intraperitoneal air is identified. Extensive aortoiliac atherosclerosis without aneurysm is identified. There is no lymphadenopathy or fluid collection. The uterus has been removed. The patient is status post left hip replacement and fixation of a right hip fracture. Scoliosis and marked multilevel spondylosis are noted. No lytic or sclerotic lesion is identified. IMPRESSION: Findings most consistent with proctitis as described above. Negative for abscess  or CT signs of bowel ischemia. Large gallstone versus gallbladder wall calcification, unchanged. Atherosclerosis. Electronically Signed   By: Drusilla Kanner M.D.   On: 03/18/2015 09:48   Dg Abd 2 Views  03/18/2015  CLINICAL DATA:  Nausea for last 2 days, abdominal pain, vomiting EXAM: ABDOMEN - 2 VIEW COMPARISON:  10/04/2012 FINDINGS: Study is suboptimal due to patient's large body habitus. There is normal small bowel gas pattern. Degenerative changes and mild levoscoliosis lumbar spine. Moderate stool noted in right colon and descending colon. Some colonic gas noted in sigmoid colon. 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 IMPRESSION: There is normal small bowel gas pattern. Degenerative changes and mild levoscoliosis lumbar spine. Moderate stool noted in right colon and descending colon. Some colonic gas noted in sigmoid colon. 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 Electronically Signed   By: Natasha Mead M.D.   On: 03/18/2015 08:14    EKG:   Orders placed or performed in visit on 02/16/14  . EKG 12-Lead  IMPRESSION AND PLAN:   Assessment and plan  1. Sepsis  from acute proctitis-meets septic criteria with tachycardia and leukocytosis CAT scan of the abdomen and pelvis has revealed  Proctitis We will get blood cultures and stool Urinalysis is negative Patient is started on broad-spectrum IV antibiotic Zosyn and will continue the same Consult gastroenterology Pain management as needed Hydration with IV fluids Start patient on liquid diet and advance as tolerated  2. Intractable nausea and vomiting probably from problem #1 Provide IV fluids for hydration Symptomatic treatment with antiemetics as needed basis PPI  3. Diabetes mellitus Currently patient is on liquid diet as tolerated Will put her on sliding scale insulin and hold home medications  4. Essential hypertension Blood pressure is  stable. Holding home medications at this time as patient comes with nausea and vomiting. Currently on clear liquid diet. Provide the patient on IV Lopressor as needed basis  5. COPD no exacerbation continue home nebulizer's including Spiriva  6. History of dementia and hard of hearing No interventions needed at this time. Monitor clinically     All the records are reviewed and case discussed with ED provider. Management plans discussed with the patient, family and they are in agreement.  CODE STATUS: DO NOT RESUSCITATE, daughter Ms. Zella Ball is the healthcare power of attorney  TOTAL TIME TAKING CARE OF THIS PATIENT: 45 minutes.    Ramonita Lab M.D on 03/18/2015 at 11:21 AM  Between 7am to 6pm - Pager - [phone]  After 6pm go to [email or web address] - password [email or web address] [facility]  [place]  [facility]  Office  [phone]  CC: Primary care physician; Lauro Regulus., MD

## 2015-03-19 DIAGNOSIS — I4891 Unspecified atrial fibrillation: Secondary | ICD-10-CM | POA: Diagnosis present

## 2015-03-19 LAB — COMPREHENSIVE METABOLIC PANEL
ALT: 7 U/L — AB (ref 14–54)
AST: 10 U/L — ABNORMAL LOW (ref 15–41)
Albumin: 2.4 g/dL — ABNORMAL LOW (ref 3.5–5.0)
Alkaline Phosphatase: 50 U/L (ref 38–126)
Anion gap: 10 (ref 5–15)
BUN: 13 mg/dL (ref 6–20)
CHLORIDE: 111 mmol/L (ref 101–111)
CO2: 22 mmol/L (ref 22–32)
CREATININE: 0.66 mg/dL (ref 0.44–1.00)
Calcium: 8.1 mg/dL — ABNORMAL LOW (ref 8.9–10.3)
Glucose, Bld: 157 mg/dL — ABNORMAL HIGH (ref 65–99)
Potassium: 3.6 mmol/L (ref 3.5–5.1)
Sodium: 143 mmol/L (ref 135–145)
Total Bilirubin: 1.2 mg/dL (ref 0.3–1.2)
Total Protein: 5.8 g/dL — ABNORMAL LOW (ref 6.5–8.1)

## 2015-03-19 LAB — GLUCOSE, CAPILLARY
GLUCOSE-CAPILLARY: 140 mg/dL — AB (ref 65–99)
GLUCOSE-CAPILLARY: 131 mg/dL — AB (ref 65–99)
Glucose-Capillary: 100 mg/dL — ABNORMAL HIGH (ref 65–99)
Glucose-Capillary: 139 mg/dL — ABNORMAL HIGH (ref 65–99)

## 2015-03-19 LAB — CBC
HCT: 35.3 % (ref 35.0–47.0)
Hemoglobin: 11.5 g/dL — ABNORMAL LOW (ref 12.0–16.0)
MCH: 31.9 pg (ref 26.0–34.0)
MCHC: 32.5 g/dL (ref 32.0–36.0)
MCV: 98 fL (ref 80.0–100.0)
PLATELETS: 272 10*3/uL (ref 150–440)
RBC: 3.6 MIL/uL — AB (ref 3.80–5.20)
RDW: 13.4 % (ref 11.5–14.5)
WBC: 15 10*3/uL — AB (ref 3.6–11.0)

## 2015-03-19 LAB — HEMOGLOBIN A1C: Hgb A1c MFr Bld: 7.3 % — ABNORMAL HIGH (ref 4.0–6.0)

## 2015-03-19 LAB — MRSA PCR SCREENING: MRSA BY PCR: POSITIVE — AB

## 2015-03-19 MED ORDER — CHLORHEXIDINE GLUCONATE CLOTH 2 % EX PADS
6.0000 | MEDICATED_PAD | Freq: Every day | CUTANEOUS | Status: DC
  Administered 2015-03-20: 6 via TOPICAL

## 2015-03-19 MED ORDER — DICYCLOMINE HCL 20 MG PO TABS
20.0000 mg | ORAL_TABLET | Freq: Three times a day (TID) | ORAL | Status: DC
  Administered 2015-03-19 – 2015-03-20 (×4): 20 mg via ORAL
  Filled 2015-03-19 (×5): qty 1

## 2015-03-19 MED ORDER — DULOXETINE HCL 30 MG PO CPEP
60.0000 mg | ORAL_CAPSULE | Freq: Every day | ORAL | Status: DC
  Administered 2015-03-19 – 2015-03-20 (×2): 60 mg via ORAL
  Filled 2015-03-19 (×2): qty 2

## 2015-03-19 MED ORDER — MUPIROCIN 2 % EX OINT
1.0000 "application " | TOPICAL_OINTMENT | Freq: Two times a day (BID) | CUTANEOUS | Status: DC
  Administered 2015-03-19 – 2015-03-20 (×3): 1 via NASAL
  Filled 2015-03-19: qty 22

## 2015-03-19 MED ORDER — CALCIUM CARBONATE 1500 (600 CA) MG PO TABS
1500.0000 mg | ORAL_TABLET | Freq: Two times a day (BID) | ORAL | Status: DC
  Filled 2015-03-19 (×2): qty 1

## 2015-03-19 MED ORDER — PANTOPRAZOLE SODIUM 40 MG PO TBEC
40.0000 mg | DELAYED_RELEASE_TABLET | Freq: Two times a day (BID) | ORAL | Status: DC
  Administered 2015-03-19 – 2015-03-20 (×2): 40 mg via ORAL
  Filled 2015-03-19 (×2): qty 1

## 2015-03-19 MED ORDER — INSULIN DETEMIR 100 UNIT/ML ~~LOC~~ SOLN
22.0000 [IU] | Freq: Every morning | SUBCUTANEOUS | Status: DC
  Administered 2015-03-19: 22 [IU] via SUBCUTANEOUS
  Filled 2015-03-19 (×3): qty 0.22

## 2015-03-19 MED ORDER — PRAVASTATIN SODIUM 20 MG PO TABS
20.0000 mg | ORAL_TABLET | Freq: Every day | ORAL | Status: DC
  Administered 2015-03-19: 20 mg via ORAL
  Filled 2015-03-19: qty 1

## 2015-03-19 MED ORDER — METOPROLOL TARTRATE 25 MG PO TABS
25.0000 mg | ORAL_TABLET | Freq: Two times a day (BID) | ORAL | Status: DC
  Administered 2015-03-19 – 2015-03-20 (×3): 25 mg via ORAL
  Filled 2015-03-19 (×3): qty 1

## 2015-03-19 MED ORDER — TRAMADOL HCL 50 MG PO TABS
50.0000 mg | ORAL_TABLET | ORAL | Status: DC | PRN
Start: 2015-03-19 — End: 2015-03-20

## 2015-03-19 MED ORDER — VITAMIN D3 25 MCG (1000 UNIT) PO TABS
2000.0000 [IU] | ORAL_TABLET | Freq: Every day | ORAL | Status: DC
  Filled 2015-03-19: qty 2

## 2015-03-19 MED ORDER — CALCIUM CARBONATE ANTACID 500 MG PO CHEW
3.0000 | CHEWABLE_TABLET | Freq: Two times a day (BID) | ORAL | Status: DC
  Administered 2015-03-19 – 2015-03-20 (×3): 600 mg via ORAL
  Filled 2015-03-19 (×3): qty 3

## 2015-03-19 MED ORDER — RIVAROXABAN 20 MG PO TABS
20.0000 mg | ORAL_TABLET | Freq: Every day | ORAL | Status: DC
  Administered 2015-03-20: 20 mg via ORAL
  Filled 2015-03-19: qty 1

## 2015-03-19 MED ORDER — METOCLOPRAMIDE HCL 5 MG PO TABS
5.0000 mg | ORAL_TABLET | Freq: Three times a day (TID) | ORAL | Status: DC
  Administered 2015-03-19 – 2015-03-20 (×4): 5 mg via ORAL
  Filled 2015-03-19 (×4): qty 1

## 2015-03-19 MED ORDER — VITAMIN D 1000 UNITS PO TABS
2000.0000 [IU] | ORAL_TABLET | Freq: Every day | ORAL | Status: DC
  Administered 2015-03-19 – 2015-03-20 (×2): 2000 [IU] via ORAL
  Filled 2015-03-19 (×2): qty 2

## 2015-03-19 MED ORDER — INSULIN DETEMIR 100 UNIT/ML ~~LOC~~ SOLN
10.0000 [IU] | Freq: Every evening | SUBCUTANEOUS | Status: DC
  Administered 2015-03-20: 10 [IU] via SUBCUTANEOUS
  Filled 2015-03-19 (×2): qty 0.1

## 2015-03-19 MED ORDER — MONTELUKAST SODIUM 10 MG PO TABS
10.0000 mg | ORAL_TABLET | Freq: Every day | ORAL | Status: DC
Start: 2015-03-19 — End: 2015-03-20
  Administered 2015-03-19: 10 mg via ORAL
  Filled 2015-03-19: qty 1

## 2015-03-19 NOTE — Consult Note (Signed)
See Sung Amabile note for complete consult.  Patient an 82 y/o WF with hx of onset of intractable nausea and vomiting but no signif abd pain.  A CT of abd and pelvis was read as showing proctitis.  This can sometimes be mis read due to rectal contractions.  A rectal exam today showed no blood.  It would be unusual for proctitis to cause vomiting and elevated white count and CT findings and not have blood on exam finger. She may have had a viral gastroenteritis which is passing now.  I would hold on any invasive studies at this time.  Will follow with you.

## 2015-03-19 NOTE — Care Management (Signed)
Patient is from Patient presents from [facility] under a long term plan of care.  CSW is involved

## 2015-03-19 NOTE — Progress Notes (Signed)
Pt has no recorded urinary output. Bladder scan performed, shows 357 ml in bladder. I will continue to assess. I will repeat bladder scan if needed and check patient for incontinence.

## 2015-03-19 NOTE — Progress Notes (Signed)
Patient was transferred from 1C following an acute onset of afib with HR in the 140s. Patient was accompanied by her daughters. On admission patient denied palpitation and chest pain and N&V. Patient VS was WDL 1 time dose of  Metoprolol IV push was administered for >100 per order.  03/19/2015 0215: Patient converted back to NSR and remained NSR with HR of 70-80 overnight.

## 2015-03-19 NOTE — Progress Notes (Addendum)
[facility] - [place] at [facility]   PATIENT NAME: [name]    MR#:  [id number]  DATE OF BIRTH:  [birth year]  SUBJECTIVE:  Patient admitted 03/18/15 with nausea and vomiting found to have proctitis Overnight had episode of atrial fibrillation rapid ventricular response was transferred to telemetry fortunately has been in normal sinus rhythm since then. No complaints this morning patient's daughter at bedside   REVIEW OF SYSTEMS:  CONSTITUTIONAL: No fever, positive fatigue or weakness.  EYES: No blurred or double vision.  EARS, NOSE, AND THROAT: No tinnitus or ear pain.  RESPIRATORY: No cough, shortness of breath, wheezing or hemoptysis.  CARDIOVASCULAR: No chest pain, orthopnea, edema.  GASTROINTESTINAL: No nausea, vomiting, diarrhea or abdominal pain.  GENITOURINARY: No dysuria, hematuria.  ENDOCRINE: No polyuria, nocturia,  HEMATOLOGY: No anemia, easy bruising or bleeding SKIN: No rash or lesion. MUSCULOSKELETAL: No joint pain or arthritis.   NEUROLOGIC: No tingling, numbness, weakness.  PSYCHIATRY: No anxiety or depression.   DRUG ALLERGIES:  No Known Allergies  VITALS:  Blood pressure 129/59, pulse 88, temperature 98.3 F (36.8 C), temperature source Oral, resp. rate 20, height  (1.549 m), weight 84.278 kg (185 lb 12.8 oz), SpO2 94 %.  PHYSICAL EXAMINATION:  VITAL SIGNS: Filed Vitals:   03/19/15 0357 03/19/15 0530  BP: 177/124 129/59  Pulse: 95 88  Temp: 98.3 F (36.8 C)   Resp: 20    GENERAL:82 y.o.female currently in no acute distress. Sleeping but easily arousable HEAD: Normocephalic, atraumatic.  EYES: Pupils equal, round, reactive to light. Extraocular muscles intact. No scleral icterus.  MOUTH: Moist mucosal membrane. Dentition intact. No abscess noted.  EAR, NOSE, THROAT: Clear without exudates. No external lesions.  NECK: Supple. No thyromegaly. No nodules. No JVD.  PULMONARY: Clear to ascultation, without wheeze rails  or rhonci. No use of accessory muscles, Good respiratory effort. good air entry bilaterally CHEST: Nontender to palpation.  CARDIOVASCULAR: S1 and S2. Regular rate and rhythm. No murmurs, rubs, or gallops. No edema. Pedal pulses 2+ bilaterally.  GASTROINTESTINAL: Soft, nontender, nondistended. No masses. Positive bowel sounds. No hepatosplenomegaly.  MUSCULOSKELETAL: No swelling, clubbing, or edema. Range of motion full in all extremities.  NEUROLOGIC: Cranial nerves II through XII are intact. No gross focal neurological deficits. Sensation intact. Reflexes intact.  SKIN: No ulceration, lesions, rashes, or cyanosis. Skin warm and dry. Turgor intact.  PSYCHIATRIC: Mood, affect within normal limits. The patient is awake, alert and oriented x 3. Insight, judgment intact.      LABORATORY PANEL:   CBC  Recent Labs Lab 03/19/15 0525  WBC 15.0*  HGB 11.5*  HCT 35.3  PLT 272   ------------------------------------------------------------------------------------------------------------------  Chemistries   Recent Labs Lab 03/19/15 0525  NA 143  K 3.6  CL 111  CO2 22  GLUCOSE 157*  BUN 13  CREATININE 0.66  CALCIUM 8.1*  AST 10*  ALT 7*  ALKPHOS 50  BILITOT 1.2   ------------------------------------------------------------------------------------------------------------------  Cardiac Enzymes No results for input(s): TROPONINI in the last 168 hours. ------------------------------------------------------------------------------------------------------------------  RADIOLOGY:  Ct Abdomen Pelvis W Contrast  03/18/2015  CLINICAL DATA:  Nausea for the past 2 days.  Initial encounter. EXAM: CT ABDOMEN AND PELVIS WITH CONTRAST TECHNIQUE: Multidetector CT imaging of the abdomen and pelvis was performed using the standard protocol following bolus administration of intravenous contrast. CONTRAST:  100 mL OMNIPAQUE IOHEXOL 300 MG/ML  SOLN COMPARISON:  CT abdomen and pelvis 05/16/2012.  FINDINGS: The lung bases are clear. No pleural or pericardial effusion.  There is cardiomegaly. Calcification of the wall fundus of the gallbladder is unchanged. No evidence of cholecystitis is identified. The liver, spleen, adrenal glands, pancreas and right kidney are unremarkable. Left renal cyst is unchanged. The There is thickening of the walls of the distal sigmoid colon and rectum with surrounding mild stranding. No diverticular disease identified. Somewhat prominent stool burden in the rectosigmoid colon is noted. The colon is otherwise unremarkable. The stomach and small bowel appear normal. No pneumatosis, portal venous gas or free intraperitoneal air is identified. Extensive aortoiliac atherosclerosis without aneurysm is identified. There is no lymphadenopathy or fluid collection. The uterus has been removed. The patient is status post left hip replacement and fixation of a right hip fracture. Scoliosis and marked multilevel spondylosis are noted. No lytic or sclerotic lesion is identified. IMPRESSION: Findings most consistent with proctitis as described above. Negative for abscess or CT signs of bowel ischemia. Large gallstone versus gallbladder wall calcification, unchanged. Atherosclerosis. Electronically Signed   By: Drusilla Kanner M.D.   On: 03/18/2015 09:48   Dg Abd 2 Views  03/18/2015  CLINICAL DATA:  Nausea for last 2 days, abdominal pain, vomiting EXAM: ABDOMEN - 2 VIEW COMPARISON:  10/04/2012 FINDINGS: Study is suboptimal due to patient's large body habitus. There is normal small bowel gas pattern. Degenerative changes and mild levoscoliosis lumbar spine. Moderate stool noted in right colon and descending colon. Some colonic gas noted in sigmoid colon. 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 IMPRESSION: There is normal small bowel gas pattern. Degenerative changes and mild levoscoliosis lumbar spine. Moderate stool noted in right colon and descending  colon. Some colonic gas noted in sigmoid colon. 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 Electronically Signed   By: Natasha Mead M.D.   On: 03/18/2015 08:14    EKG:   Orders placed or performed during the hospital encounter of 03/18/15  . EKG 12-Lead  . EKG 12-Lead    ASSESSMENT AND PLAN:  82 year old Caucasian female admitted 03/18/15 nausea vomiting sepsis secondary to proctitis  1. Sepsis from acute proctitis-meets  Patient is started on broad-spectrum IV antibiotic Zosyn day 1/7 Follow-up with gastroenterology Pain management as needed Advance diet  2. Atrial fibrillation rapid ventricular response: Actually converted normal sinus rhythm no further intervention at this time  3. Diabetes mellitus 2, non-insulin-requiring Hold oral agents at insulin sliding scale  4. Essential hypertension Restart home medications  5. COPD no exacerbation continue home nebulizer's including Spiriva  6. History of dementia and hard of hearing No interventions needed at this time. Monitor clinically  Venous thromboembolism prophylactic: On Xarelto    All the records are reviewed and case discussed with Care Management/Social Workerr. Management plans discussed with the patient, family and they are in agreement.  CODE STATUS: DO NOT RESUSCITATE  TOTAL TIME TAKING CARE OF THIS PATIENT: 33 minutes.   POSSIBLE D/C IN 1-2 DAYS, DEPENDING ON CLINICAL CONDITION.   Angus Amini,  Mardi Mainland.D on 03/19/2015 at 1:55 PM  Between 7am to 6pm - Pager - [phone]  After 6pm: House Pager: - [phone]  [facility]  Office  [phone]  CC: Primary care physician; Lauro Regulus., MD

## 2015-03-19 NOTE — Progress Notes (Signed)
Patient not availably. Family advised just pray for patient. Family will have nurse page Chaplain if needed.

## 2015-03-19 NOTE — Progress Notes (Signed)
CSW received a consult that patient is from [place]. CSW contacted [name] from [place]. Per [name] patient is a LTC resident. She reports that patient can return at discharge. CSW spoke to patient at bedside. She reports that she cannot "think of where I live". She informed CSW that she lived in a facility. She stated she will return to the facility at discharge because her daughters could not take care of her.Granted CSW verbal permission to speak to her daughters Lucky Cowboy and Cecilie Kicks). CSW contacted Cecilie Kicks and spoke to her outside patient's room. Per Carney Bern patient is a LTC resident at [facility]. She reports that patient has "gone downhill" since she began living at this facility. She stated that patient does not walk anymore. Per Carney Bern she would patient to return at discharge. CSW will continue to follow and assist.  Woodroe Mode, MSW, LCSW-A Clinical Social Work Department [phone]

## 2015-03-19 NOTE — Consult Note (Signed)
GI Inpatient Consult Note  Reason for Consult:  Acute Proctitis   Attending Requesting Consult: Dr. Clint Guy  History of Present Illness: [name] is a 82 y.o. female  with a known hx of DM, HTN, CAD, HLD, collagen vascular dz, and COPD presented to the [facility] ED on 03/18/15 with complaints of 2-3 day history of nausea and vomiting.    Hx was difficult to obtain - pt has dementia and wanted to sleep during H&P exam.  Daughter, sister, and sister's fianc were at bedside, but they do not often go to visit her in her retirement facility and are unaware of her medical or health situation.    She is on Xarelto, Bentyl, Reglan, and 40 mg Protonix BID in the outpatient setting.  Endoscopy (09/19/12): LA Grade C esophagitis with one cratered gastric ulcer, one cratered duodenal ulcer, mild chronic gastritis, lower esophageal acute esophagitis Endoscopy (10/18/12): examined esophagus was normal Colonoscopy (10/18/12): entire colon normal  Past Medical History:  Past Medical History  Diagnosis Date  . Diabetes mellitus without complication (HCC)   . Hypertension   . COPD (chronic obstructive pulmonary disease) (HCC)   . Coronary artery disease   . Hyperlipidemia   . Renal disorder   . Collagen vascular disease (HCC)     Problem List: Patient Active Problem List   Diagnosis Date Noted  . Proctitis 03/18/2015    Past Surgical History: Past Surgical History  Procedure Laterality Date  . Cesarean section      Allergies: No Known Allergies  Home Medications: Prescriptions prior to admission  Medication Sig Dispense Refill Last Dose  . acetaminophen (TYLENOL) 325 MG tablet Take 650 mg by mouth every 8 (eight) hours.   Unknown at Unknown time  . budesonide-formoterol (SYMBICORT) 160-4.5 MCG/ACT inhaler Inhale 2 puffs into the lungs every 12 (twelve) hours.   Unknown at Unknown time  . calcium carbonate (OSCAL) 1500 (600 Ca) MG TABS tablet Take 1,500 mg by mouth 2 (two) times daily with a meal.    Unknown at Unknown time  . cholecalciferol (VITAMIN D) 1000 units tablet Take 2,000 Units by mouth daily.   Unknown at Unknown time  . cyanocobalamin (,VITAMIN B-12,) 1000 MCG/ML injection Inject 1,000 mcg into the muscle every 28 (twenty-eight) days.   Past Month at Unknown time  . dicyclomine (BENTYL) 20 MG tablet Take 20 mg by mouth 3 (three) times daily.   Unknown at Unknown time  . DULoxetine (CYMBALTA) 60 MG capsule Take 60 mg by mouth daily.   Unknown at Unknown time  . fluticasone (FLONASE) 50 MCG/ACT nasal spray Place 2 sprays into both nostrils at bedtime.   Unknown at Unknown time  . glipiZIDE (GLUCOTROL) 5 MG tablet Take 5 mg by mouth 2 (two) times daily.   Unknown at Unknown time  . guaiFENesin (ROBITUSSIN) 100 MG/5ML liquid Take 200 mg by mouth every 4 (four) hours as needed for cough.   prn  . insulin NPH Human (HUMULIN N,NOVOLIN N) 100 UNIT/ML injection Inject 10-22 Units into the skin 2 (two) times daily. Give 22 units every morning and 10 units in the evening.   Unknown at Unknown time  . insulin regular (NOVOLIN R,HUMULIN R) 100 units/mL injection Inject 3-8 Units into the skin as needed for high blood sugar. Per sliding scale: If blood sugar 251-325 give 3 units, 326-450 give 6 units, >450 give 8 units   prn  . loperamide (IMODIUM) 2 MG capsule Take 2-4 mg by mouth as needed  for diarrhea or loose stools. Reported on 03/18/2015   prn  . LORazepam (ATIVAN) 0.5 MG tablet Take 0.5 mg by mouth every 4 (four) hours as needed for anxiety.   prn  . metoCLOPramide (REGLAN) 5 MG tablet Take 5 mg by mouth 3 (three) times daily.   Unknown at Unknown time  . metoprolol tartrate (LOPRESSOR) 25 MG tablet Take 25 mg by mouth 2 (two) times daily.   Unknown at Unknown time  . montelukast (SINGULAIR) 10 MG tablet Take 10 mg by mouth at bedtime.   Unknown at Unknown time  . pantoprazole (PROTONIX) 40 MG tablet Take 40 mg by mouth 2 (two) times daily.   Unknown at Unknown time  . phenylephrine-shark  liver oil-mineral oil-petrolatum (PREPARATION H) 0.25-14-74.9 % rectal ointment Place 1 application rectally as needed for hemorrhoids.   prn  . potassium chloride (KLOR-CON) 20 MEQ packet Take 20 mEq by mouth daily.   Unknown at Unknown time  . pravastatin (PRAVACHOL) 20 MG tablet Take 20 mg by mouth at bedtime.   Unknown at Unknown time  . promethazine (PHENERGAN) 25 MG tablet Take 25 mg by mouth every 4 (four) hours as needed for nausea or vomiting.   prn  . rivaroxaban (XARELTO) 20 MG TABS tablet Take 20 mg by mouth daily.   Unknown at Unknown time  . scopolamine (TRANSDERM-SCOP) 1 MG/3DAYS Place 1 patch onto the skin every 3 (three) days.   Unknown at Unknown time  . senna-docusate (SENOKOT-S) 8.6-50 MG tablet Take 1 tablet by mouth 2 (two) times daily.   Unknown at Unknown time  . sodium chloride (OCEAN) 0.65 % SOLN nasal spray Place 2 sprays into both nostrils as needed for congestion.   prn  . tiotropium (SPIRIVA) 18 MCG inhalation capsule Place 18 mcg into inhaler and inhale daily.   Unknown at Unknown time  . torsemide (DEMADEX) 10 MG tablet Take 15 mg by mouth daily.   Unknown at Unknown time  . traMADol (ULTRAM) 50 MG tablet Take 50 mg by mouth every 4 (four) hours as needed.   prn  . traMADol (ULTRAM) 50 MG tablet Take 50 mg by mouth 4 (four) times daily. OK to use additional PRN doses if needed.   Unknown at Unknown time  . trolamine salicylate (ASPERCREME) 10 % cream Apply 1 application topically 4 (four) times daily.   Unknown at Unknown time  . witch hazel-glycerin (TUCKS) pad Apply 1-2 application topically as needed for itching.   prn   Home medication reconciliation was completed with the patient.   Scheduled Inpatient Medications:   . budesonide-formoterol  2 puff Inhalation Q12H  . capsaicin   Topical QID  . Chlorhexidine Gluconate Cloth  6 each Topical Q0600  . fluticasone  2 spray Each Nare QHS  . insulin aspart  0-5 Units Subcutaneous QHS  . insulin aspart  0-9 Units  Subcutaneous TID WC  .  morphine injection  2 mg Intravenous Once  . mupirocin ointment  1 application Nasal BID  . pantoprazole (PROTONIX) IV  40 mg Intravenous Q12H  . piperacillin-tazobactam (ZOSYN)  IV  3.375 g Intravenous 3 times per day  . rivaroxaban  20 mg Oral Daily  . sodium chloride flush  3 mL Intravenous Q12H  . tiotropium  18 mcg Inhalation Daily    Continuous Inpatient Infusions:   . sodium chloride 100 mL/hr at 03/19/15 0451    PRN Inpatient Medications:  acetaminophen **OR** acetaminophen, metoprolol, morphine injection, ondansetron **OR** ondansetron (ZOFRAN)  IV, oxyCODONE, sodium chloride  Family History: family history is not on file.    Social History:   reports that she has never smoked. She has never used smokeless tobacco. She reports that she does not drink alcohol or use illicit drugs.   Review of Systems: Unable to assess due to demented state   Physical Examination: BP 129/59 mmHg  Pulse 88  Temp(Src) 98.3 F (36.8 C) (Oral)  Resp 20  Ht 5\' 1"  (1.549 m)  Wt 84.278 kg (185 lb 12.8 oz)  BMI 35.12 kg/m2  SpO2 94% Gen: NAD, family members present, but unable to add to history. HEENT: PEERLA, EOMI, Neck: supple, no JVD or thyromegaly Chest: CTA bilaterally, no wheezes, crackles, or other adventitious sounds CV: irr irr Abd: soft, NT, ND, +BS in all four quadrants; no HSM, guarding, ridigity, or rebound tenderness Ext: no edema, well perfused with 2+ pulses, Skin: no rash or lesions noted Lymph: no LAD Rectal exam: negative without mass, lesions or tenderness, stool guaiac negative.  Data: Lab Results  Component Value Date   WBC 15.0* 03/19/2015   HGB 11.5* 03/19/2015   HCT 35.3 03/19/2015   MCV 98.0 03/19/2015   PLT 272 03/19/2015    Recent Labs Lab 03/18/15 0820 03/19/15 0525  HGB 12.9 11.5*   Lab Results  Component Value Date   NA 143 03/19/2015   K 3.6 03/19/2015   CL 111 03/19/2015   CO2 22 03/19/2015   BUN 13 03/19/2015    CREATININE 0.66 03/19/2015   Lab Results  Component Value Date   ALT 7* 03/19/2015   AST 10* 03/19/2015   ALKPHOS 50 03/19/2015   BILITOT 1.2 03/19/2015   No results for input(s): APTT, INR, PTT in the last 168 hours.  Imaging: CLINICAL DATA: Nausea for the past 2 days. Initial encounter.  EXAM: CT ABDOMEN AND PELVIS WITH CONTRAST  TECHNIQUE: Multidetector CT imaging of the abdomen and pelvis was performed using the standard protocol following bolus administration of intravenous contrast.  CONTRAST: 100 mL OMNIPAQUE IOHEXOL 300 MG/ML SOLN  COMPARISON: CT abdomen and pelvis 05/16/2012.  FINDINGS: The lung bases are clear. No pleural or pericardial effusion. There is cardiomegaly.  Calcification of the wall fundus of the gallbladder is unchanged. No evidence of cholecystitis is identified. The liver, spleen, adrenal glands, pancreas and right kidney are unremarkable. Left renal cyst is unchanged. The  There is thickening of the walls of the distal sigmoid colon and rectum with surrounding mild stranding. No diverticular disease identified. Somewhat prominent stool burden in the rectosigmoid colon is noted. The colon is otherwise unremarkable. The stomach and small bowel appear normal. No pneumatosis, portal venous gas or free intraperitoneal air is identified.  Extensive aortoiliac atherosclerosis without aneurysm is identified. There is no lymphadenopathy or fluid collection. The uterus has been removed.  The patient is status post left hip replacement and fixation of a right hip fracture. Scoliosis and marked multilevel spondylosis are noted. No lytic or sclerotic lesion is identified.  IMPRESSION: Findings most consistent with proctitis as described above. Negative for abscess or CT signs of bowel ischemia.  Large gallstone versus gallbladder wall calcification, unchanged.  Atherosclerosis.   Electronically Signed  By: Drusilla Kanner M.D.  On: 03/18/2015 09:48   CLINICAL DATA: Nausea for last 2 days, abdominal pain, vomiting  EXAM: ABDOMEN - 2 VIEW  COMPARISON: 10/04/2012  FINDINGS: Study is suboptimal due to patient's large body habitus. There is normal small bowel gas pattern. Degenerative changes and mild  levoscoliosis lumbar spine. Moderate stool noted in right colon and descending colon. Some colonic gas noted in sigmoid colon. 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  IMPRESSION: There is normal small bowel gas pattern. Degenerative changes and mild levoscoliosis lumbar spine. Moderate stool noted in right colon and descending colon. Some colonic gas noted in sigmoid colon. 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   Electronically Signed  By: Natasha Mead M.D.  On: 03/18/2015 08:14  Assessment/Plan: [name] is a 82 y.o. female with nausea and vomiting for 3 days, 2 episodes of vomiting in the ED. No reports of diarrhea or rectal bleeding. CT abdomen revealed thickening of the walls of the distal sigmoid colon and rectum, surrounding mild stranding - most consistent with proctitis; negative for abscess or CT signs of bowel ischemia.  She was placed on IV Zosyn.   A fib (HR 167), WBC 21  15, (2 weeks ago WBC was 16.0)  HgB 11.5, Bili 1.5, (+) MRSA, heme negative stool  Currently being treated with IV Zosyn, zofran and Protonix 40 mg BID, full liquid diet, On Xarelto   Recommendations: We recommend a GI panel by PCR.  Please see Dr. Earnest Conroy note for any further recommendations.  Thank you for the consult. Please call with questions or concerns.  Carney Harder, PA-C  I personally performed these services.

## 2015-03-20 LAB — GLUCOSE, CAPILLARY
GLUCOSE-CAPILLARY: 48 mg/dL — AB (ref 65–99)
GLUCOSE-CAPILLARY: 214 mg/dL — AB (ref 65–99)
GLUCOSE-CAPILLARY: 201 mg/dL — AB (ref 65–99)
Glucose-Capillary: 153 mg/dL — ABNORMAL HIGH (ref 65–99)
Glucose-Capillary: 63 mg/dL — ABNORMAL LOW (ref 65–99)
Glucose-Capillary: 96 mg/dL (ref 65–99)

## 2015-03-20 NOTE — Progress Notes (Signed)
Ems on the floor to transport patient to [facility]. Patient to be transported on ra. No distress noted. No c/o pain. Iv and telemetry was already removed. Packet given to ems to give to staff at [facility]

## 2015-03-20 NOTE — Progress Notes (Addendum)
[facility] -  at [facility]   PATIENT NAME: [name]    MR#:  [id number]  DATE OF BIRTH:  03-Oct-1925  SUBJECTIVE:  Without complaints this morning, more talkative more alert Denies nausea vomiting diarrhea  REVIEW OF SYSTEMS:  CONSTITUTIONAL: No fever, fatigue or weakness.  EYES: No blurred or double vision.  EARS, NOSE, AND THROAT: No tinnitus or ear pain.  RESPIRATORY: No cough, shortness of breath, wheezing or hemoptysis.  CARDIOVASCULAR: No chest pain, orthopnea, edema.  GASTROINTESTINAL: No nausea, vomiting, diarrhea or abdominal pain.  GENITOURINARY: No dysuria, hematuria.  ENDOCRINE: No polyuria, nocturia,  HEMATOLOGY: No anemia, easy bruising or bleeding SKIN: No rash or lesion. MUSCULOSKELETAL: No joint pain or arthritis.   NEUROLOGIC: No tingling, numbness, weakness.  PSYCHIATRY: No anxiety or depression.   DRUG ALLERGIES:  No Known Allergies  VITALS:  Blood pressure 107/49, pulse 70, temperature 98 F (36.7 C), temperature source Oral, resp. rate 18, height  (1.549 m), weight 86.183 kg (190 lb), SpO2 100 %.  PHYSICAL EXAMINATION:  VITAL SIGNS: Filed Vitals:   03/20/15 0343 03/20/15 0843  BP: 133/69 107/49  Pulse: 66 70  Temp: 97.7 F (36.5 C) 98 F (36.7 C)  Resp: 22 18   GENERAL:82 y.o.female currently in no acute distress.  HEAD: Normocephalic, atraumatic.  EYES: Pupils equal, round, reactive to light. Extraocular muscles intact. No scleral icterus.  MOUTH: Moist mucosal membrane. Dentition intact. No abscess noted.  EAR, NOSE, THROAT: Clear without exudates. No external lesions.  NECK: Supple. No thyromegaly. No nodules. No JVD.  PULMONARY: Clear to ascultation, without wheeze rails or rhonci. No use of accessory muscles, Good respiratory effort. good air entry bilaterally CHEST: Nontender to palpation.  CARDIOVASCULAR: S1 and S2. Regular rate and rhythm. No murmurs, rubs, or gallops. No edema. Pedal pulses  2+ bilaterally.  GASTROINTESTINAL: Soft, nontender, nondistended. No masses. Positive bowel sounds. No hepatosplenomegaly.  MUSCULOSKELETAL: No swelling, clubbing, or edema. Range of motion full in all extremities.  NEUROLOGIC: Cranial nerves II through XII are intact. No gross focal neurological deficits. Sensation intact. Reflexes intact.  SKIN: No ulceration, lesions, rashes, or cyanosis. Skin warm and dry. Turgor intact.  PSYCHIATRIC: Much more alert and responsive today Mood, affect within normal limits. The patient is awake, alert and oriented x 3. Insight, judgment intact.      LABORATORY PANEL:   CBC  Recent Labs Lab 03/19/15 0525  WBC 15.0*  HGB 11.5*  HCT 35.3  PLT 272   ------------------------------------------------------------------------------------------------------------------  Chemistries   Recent Labs Lab 03/19/15 0525  NA 143  K 3.6  CL 111  CO2 22  GLUCOSE 157*  BUN 13  CREATININE 0.66  CALCIUM 8.1*  AST 10*  ALT 7*  ALKPHOS 50  BILITOT 1.2   ------------------------------------------------------------------------------------------------------------------  Cardiac Enzymes No results for input(s): TROPONINI in the last 168 hours. ------------------------------------------------------------------------------------------------------------------  RADIOLOGY:  No results found.  EKG:   Orders placed or performed during the hospital encounter of 03/18/15  . EKG 12-Lead  . EKG 12-Lead    ASSESSMENT AND PLAN:   82 year old Caucasian female admitted 03/18/15 nausea vomiting sepsis secondary to proctitis  1. Sepsis from acute proctitis-GI note appreciated question gastroenteritis Patient is started on broad-spectrum IV antibiotic Zosyn day 2/7 Pain management as needed Advance diet further-if can tolerate normal diet with Dr. Carney Bern symptoms can be safely discharged  2. Atrial fibrillation rapid ventricular response: Resolved  Actually  converted normal sinus rhythm no further intervention at this time  3. Diabetes mellitus 2, non-insulin-requiring, episode of hypoglycemia this morning likely from decreased oral intake increased diet will hold Levemir this morning Hold oral agents at insulin sliding scale  4. Essential hypertension Good control  5. COPD no exacerbation continue home nebulizer's including Spiriva  6. History of dementia and hard of hearing No interventions needed at this time. Monitor clinically  Venous thromboembolism prophylactic: On Xarelto  Disposition: Discharge back to nursing facility today if tolerates diet   All the records are reviewed and case discussed with Care Management/Social Workerr. Management plans discussed with the patient, family and they are in agreement.  CODE STATUS: DO NOT RESUSCITATE  TOTAL TIME TAKING CARE OF THIS PATIENT: 33 minutes.   POSSIBLE D/C IN one DAYS, DEPENDING ON CLINICAL CONDITION.   Hower,  Mardi Mainland.D on 03/20/2015 at 10:04 AM  Between 7am to 6pm - Pager - [phone]  After 6pm: House Pager: - [phone]  [facility]  Office  [phone]  CC: Primary care physician; Lauro Regulus., MD

## 2015-03-20 NOTE — Progress Notes (Signed)
Clinical Social Worker was informed that patient will be medically ready to discharge to [place]. Patient and her family are in a agreement with plan. CSW called Sue Lush- admissions coordinator at [facility] to confirm that patient's bed is ready. Provided patient's room number 319 B and number to call for report [phone] . All discharge information faxed to [facility]. DNR added to discharge packet.   Call to patient's daughter Carney Bern informed her patient would discharge to [facility]. RN will call report and patient will discharge to [facility] via [facility].  Woodroe Mode, MSW, LCSW-A Clinical Social Work Department [phone]

## 2015-03-20 NOTE — Progress Notes (Signed)
Patient is discharge to [facility] room 319 in a stable condition, report given to floor nurse , left by EMS

## 2015-03-20 NOTE — Care Management Important Message (Signed)
Important Message  Patient Details  Name: [name] MRN: [id number] Date of Birth: 1925/07/06   Medicare Important Message Given:  Yes  This message was given to patient at 13:10.  Was not documented until now due to high patient census    Eber Hong, [place] 03/20/2015, 5:39 PM

## 2015-03-20 NOTE — Consult Note (Signed)
Pt hungry and eating, no vomiting, no diarrhea, no rectal bleeding.  VSS afebrile.  I think she can go back to nursing home.  No plans for colonoscopy at this time.

## 2015-03-20 NOTE — Discharge Summary (Signed)
[facility] - [place] at [facility]   PATIENT NAME: [name]    MR#:  [id number]  DATE OF BIRTH:  1925/04/07  DATE OF ADMISSION:  03/18/2015 ADMITTING PHYSICIAN: Ramonita Lab, MD  DATE OF DISCHARGE: 03/20/2015 PRIMARY CARE PHYSICIAN: Lauro Regulus., MD    ADMISSION DIAGNOSIS:  Proctitis [K62.89] Non-intractable vomiting with nausea, vomiting of unspecified type [R11.2]  DISCHARGE DIAGNOSIS:  Viral gastroenteritis Paroxysmal atrial fibrillation   SECONDARY DIAGNOSIS:   Past Medical History  Diagnosis Date  . Diabetes mellitus without complication (HCC)   . Hypertension   . COPD (chronic obstructive pulmonary disease) (HCC)   . Coronary artery disease   . Hyperlipidemia   . Renal disorder   . Collagen vascular disease [facility])     HOSPITAL COURSE:  [name]  is a 82 y.o. female admitted 03/18/2015 with chief complaint  nausea. Please see H&P performed by Dr. Amado Coe for further information. On arrival to the hospital she is noted to have leukocytosis with CT evidence concerning for colonic inflammation concerning for proctitis. She was originally started on Zosyn for antibiotic coverage, gastroenterology consult and evaluated the patient.. This is actually most likely secondary to viral gastroenteritis as her exam and findings were inconsistent with proctitis . She has made marked improvement during her hospital stay is now tolerating food without complication. Of note the original day of hospitalization showed a brief episode history of present illness relation to rapid ventricular response which responded without medication and spontaneously converted to normal sinus rhythm she remained in normal sinus rhythm the entirety of this stay   DISCHARGE CONDITIONS:    stable/improved  CONSULTS OBTAINED:  Treatment Team:  Ramonita Lab, MD Scot Jun, MD  DRUG ALLERGIES:  No Known Allergies  DISCHARGE MEDICATIONS:   Current Discharge  Medication List    CONTINUE these medications which have NOT CHANGED   Details  acetaminophen (TYLENOL) 325 MG tablet Take 650 mg by mouth every 8 (eight) hours.    budesonide-formoterol (SYMBICORT) 160-4.5 MCG/ACT inhaler Inhale 2 puffs into the lungs every 12 (twelve) hours.    calcium carbonate (OSCAL) 1500 (600 Ca) MG TABS tablet Take 1,500 mg by mouth 2 (two) times daily with a meal.    cholecalciferol (VITAMIN D) 1000 units tablet Take 2,000 Units by mouth daily.    cyanocobalamin (,VITAMIN B-12,) 1000 MCG/ML injection Inject 1,000 mcg into the muscle every 28 (twenty-eight) days.    dicyclomine (BENTYL) 20 MG tablet Take 20 mg by mouth 3 (three) times daily.    DULoxetine (CYMBALTA) 60 MG capsule Take 60 mg by mouth daily.    fluticasone (FLONASE) 50 MCG/ACT nasal spray Place 2 sprays into both nostrils at bedtime.    glipiZIDE (GLUCOTROL) 5 MG tablet Take 5 mg by mouth 2 (two) times daily.    guaiFENesin (ROBITUSSIN) 100 MG/5ML liquid Take 200 mg by mouth every 4 (four) hours as needed for cough.    insulin NPH Human (HUMULIN N,NOVOLIN N) 100 UNIT/ML injection Inject 10-22 Units into the skin 2 (two) times daily. Give 22 units every morning and 10 units in the evening.    insulin regular (NOVOLIN R,HUMULIN R) 100 units/mL injection Inject 3-8 Units into the skin as needed for high blood sugar. Per sliding scale: If blood sugar 251-325 give 3 units, 326-450 give 6 units, >450 give 8 units    loperamide (IMODIUM) 2 MG capsule Take 2-4 mg by mouth as needed for diarrhea or loose stools. Reported on 03/18/2015  LORazepam (ATIVAN) 0.5 MG tablet Take 0.5 mg by mouth every 4 (four) hours as needed for anxiety.    metoCLOPramide (REGLAN) 5 MG tablet Take 5 mg by mouth 3 (three) times daily.    metoprolol tartrate (LOPRESSOR) 25 MG tablet Take 25 mg by mouth 2 (two) times daily.    montelukast (SINGULAIR) 10 MG tablet Take 10 mg by mouth at bedtime.    pantoprazole (PROTONIX) 40  MG tablet Take 40 mg by mouth 2 (two) times daily.    phenylephrine-shark liver oil-mineral oil-petrolatum (PREPARATION H) 0.25-14-74.9 % rectal ointment Place 1 application rectally as needed for hemorrhoids.    potassium chloride (KLOR-CON) 20 MEQ packet Take 20 mEq by mouth daily.    pravastatin (PRAVACHOL) 20 MG tablet Take 20 mg by mouth at bedtime.    promethazine (PHENERGAN) 25 MG tablet Take 25 mg by mouth every 4 (four) hours as needed for nausea or vomiting.    rivaroxaban (XARELTO) 20 MG TABS tablet Take 20 mg by mouth daily.    scopolamine (TRANSDERM-SCOP) 1 MG/3DAYS Place 1 patch onto the skin every 3 (three) days.    senna-docusate (SENOKOT-S) 8.6-50 MG tablet Take 1 tablet by mouth 2 (two) times daily.    sodium chloride (OCEAN) 0.65 % SOLN nasal spray Place 2 sprays into both nostrils as needed for congestion.    tiotropium (SPIRIVA) 18 MCG inhalation capsule Place 18 mcg into inhaler and inhale daily.    torsemide (DEMADEX) 10 MG tablet Take 15 mg by mouth daily.    traMADol (ULTRAM) 50 MG tablet Take 50 mg by mouth every 4 (four) hours as needed.    trolamine salicylate (ASPERCREME) 10 % cream Apply 1 application topically 4 (four) times daily.    witch hazel-glycerin (TUCKS) pad Apply 1-2 application topically as needed for itching.         DISCHARGE INSTRUCTIONS:    DIET:  Diabetic diet  DISCHARGE CONDITION:  Stable  ACTIVITY:  Activity as tolerated  OXYGEN:  Home Oxygen: No.   Oxygen Delivery: room air  DISCHARGE LOCATION:  nursing home   If you experience worsening of your admission symptoms, develop shortness of breath, life threatening emergency, suicidal or homicidal thoughts you must seek medical attention immediately by calling 911 or calling your MD immediately  if symptoms less severe.  You Must read complete instructions/literature along with all the possible adverse reactions/side effects for all the Medicines you take and that have  been prescribed to you. Take any new Medicines after you have completely understood and accpet all the possible adverse reactions/side effects.   Please note  You were cared for by a hospitalist during your hospital stay. If you have any questions about your discharge medications or the care you received while you were in the hospital after you are discharged, you can call the unit and asked to speak with the hospitalist on call if the hospitalist that took care of you is not available. Once you are discharged, your primary care physician will handle any further medical issues. Please note that NO REFILLS for any discharge medications will be authorized once you are discharged, as it is imperative that you return to your primary care physician (or establish a relationship with a primary care physician if you do not have one) for your aftercare needs so that they can reassess your need for medications and monitor your lab values.    On the day of Discharge:   VITAL SIGNS:  Blood pressure 113/37, pulse  75, temperature 98.7 F (37.1 C), temperature source Oral, resp. rate 18, height  (1.549 m), weight 86.183 kg (190 lb), SpO2 98 %.  I/O:   Intake/Output Summary (Last 24 hours) at 03/20/15 1454 Last data filed at 03/20/15 1013  Gross per 24 hour  Intake   1400 ml  Output    150 ml  Net   1250 ml    PHYSICAL EXAMINATION:  GENERAL:  82 y.o.-year-old patient lying in the bed with no acute distress.  EYES: Pupils equal, round, reactive to light and accommodation. No scleral icterus. Extraocular muscles intact.  HEENT: Head atraumatic, normocephalic. Oropharynx and nasopharynx clear.  NECK:  Supple, no jugular venous distention. No thyroid enlargement, no tenderness.  LUNGS: Normal breath sounds bilaterally, no wheezing, rales,rhonchi or crepitation. No use of accessory muscles of respiration.  CARDIOVASCULAR: S1, S2 normal. No murmurs, rubs, or gallops.  ABDOMEN: Soft, non-tender,  non-distended. Bowel sounds present. No organomegaly or mass.  EXTREMITIES: No pedal edema, cyanosis, or clubbing.  NEUROLOGIC: Cranial nerves II through XII are intact. Muscle strength 5/5 in all extremities. Sensation intact. Gait not checked.  PSYCHIATRIC: The patient is alert and oriented x 3.  SKIN: No obvious rash, lesion, or ulcer.   DATA REVIEW:   CBC  Recent Labs Lab 03/19/15 0525  WBC 15.0*  HGB 11.5*  HCT 35.3  PLT 272    Chemistries   Recent Labs Lab 03/19/15 0525  NA 143  K 3.6  CL 111  CO2 22  GLUCOSE 157*  BUN 13  CREATININE 0.66  CALCIUM 8.1*  AST 10*  ALT 7*  ALKPHOS 50  BILITOT 1.2    Cardiac Enzymes No results for input(s): TROPONINI in the last 168 hours.  Microbiology Results  Results for orders placed or performed during the hospital encounter of 03/18/15  MRSA PCR Screening     Status: Abnormal   Collection Time: 03/18/15  2:00 AM  Result Value Ref Range Status   MRSA by PCR POSITIVE (A) NEGATIVE Final    Comment: CRITICAL RESULT CALLED TO, READ BACK BY AND VERIFIED WITH: SUNGI DAKRA ON 03/19/15 AT 0425 [facility]        The GeneXpert MRSA Assay (FDA approved for NASAL specimens only), is one component of a comprehensive MRSA colonization surveillance program. It is not intended to diagnose MRSA infection nor to guide or monitor treatment for MRSA infections.   CULTURE, BLOOD (ROUTINE X 2) w Reflex to PCR ID Panel     Status: None (Preliminary result)   Collection Time: 03/18/15  1:48 PM  Result Value Ref Range Status   Specimen Description BLOOD LEFT ASSIST CONTROL  Final   Special Requests BOTTLES DRAWN AEROBIC AND ANAEROBIC 1CCAERO,1CCANA  Final   Culture NO GROWTH 2 DAYS  Final   Report Status PENDING  Incomplete  CULTURE, BLOOD (ROUTINE X 2) w Reflex to PCR ID Panel     Status: None (Preliminary result)   Collection Time: 03/18/15  1:48 PM  Result Value Ref Range Status   Specimen Description BLOOD LEFT FATTY CASTS  Final    Special Requests BOTTLES DRAWN AEROBIC AND ANAEROBIC 2CCAERO,2CCANA  Final   Culture NO GROWTH 2 DAYS  Final   Report Status PENDING  Incomplete    RADIOLOGY:  No results found.   Management plans discussed with the patient, family and they are in agreement.  CODE STATUS:     Code Status Orders        Start  Ordered   03/18/15 1734  Do not attempt resuscitation (DNR)   Continuous    Question Answer Comment  In the event of cardiac or respiratory ARREST Do not call a "code blue"   In the event of cardiac or respiratory ARREST Do not perform Intubation, CPR, defibrillation or ACLS   In the event of cardiac or respiratory ARREST Use medication by any route, position, wound care, and other measures to relive pain and suffering. May use oxygen, suction and manual treatment of airway obstruction as needed for comfort.   Comments RN may pronounce      03/18/15 1733    Code Status History    Date Active Date Inactive Code Status Order ID Comments User Context   This patient has a current code status but no historical code status.    Advance Directive Documentation        Most Recent Value   Type of Advance Directive  Out of facility DNR (pink MOST or yellow form)   Pre-existing out of facility DNR order (yellow form or pink MOST form)     "MOST" Form in Place?        TOTAL TIME TAKING CARE OF THIS PATIENT: 28 minutes.    [name],  Mardi Mainland.D on 03/20/2015 at 2:54 PM  Between 7am to 6pm - Pager - [phone]  After 6pm go to [email or web address] - password [email or web address] [facility]  [place] [facility]  Office  [phone]  CC: Primary care physician; Lauro Regulus., MD

## 2015-03-20 NOTE — Progress Notes (Signed)
DR Hower was made aware of pt's low blood sugar and stated to hold am dose of levemir, will continue to monitor

## 2015-03-20 NOTE — Progress Notes (Signed)
More alert and conversant this shift.  Requesting and tolerated water to drink this shift.  No nausea or vomiting noted.

## 2015-03-21 DIAGNOSIS — E1129 Type 2 diabetes mellitus with other diabetic kidney complication: Secondary | ICD-10-CM | POA: Diagnosis not present

## 2015-03-21 LAB — GLUCOSE, CAPILLARY
GLUCOSE-CAPILLARY: 202 mg/dL — AB (ref 65–99)
Glucose-Capillary: 198 mg/dL — ABNORMAL HIGH (ref 65–99)

## 2015-03-22 DIAGNOSIS — E1129 Type 2 diabetes mellitus with other diabetic kidney complication: Secondary | ICD-10-CM | POA: Diagnosis not present

## 2015-03-22 LAB — GLUCOSE, CAPILLARY
Glucose-Capillary: 70 mg/dL (ref 65–99)
Glucose-Capillary: 230 mg/dL — ABNORMAL HIGH (ref 65–99)

## 2015-03-23 DIAGNOSIS — E1129 Type 2 diabetes mellitus with other diabetic kidney complication: Secondary | ICD-10-CM | POA: Diagnosis not present

## 2015-03-23 LAB — GLUCOSE, CAPILLARY
GLUCOSE-CAPILLARY: 66 mg/dL (ref 65–99)
Glucose-Capillary: 123 mg/dL — ABNORMAL HIGH (ref 65–99)

## 2015-03-24 DIAGNOSIS — E1129 Type 2 diabetes mellitus with other diabetic kidney complication: Secondary | ICD-10-CM | POA: Diagnosis not present

## 2015-03-24 LAB — CULTURE, BLOOD (ROUTINE X 2)
CULTURE: NO GROWTH
CULTURE: NO GROWTH

## 2015-03-24 LAB — GLUCOSE, CAPILLARY
GLUCOSE-CAPILLARY: 111 mg/dL — AB (ref 65–99)
Glucose-Capillary: 236 mg/dL — ABNORMAL HIGH (ref 65–99)

## 2015-03-25 DIAGNOSIS — E1129 Type 2 diabetes mellitus with other diabetic kidney complication: Secondary | ICD-10-CM | POA: Diagnosis not present

## 2015-03-25 LAB — GLUCOSE, CAPILLARY
GLUCOSE-CAPILLARY: 128 mg/dL — AB (ref 65–99)
GLUCOSE-CAPILLARY: 172 mg/dL — AB (ref 65–99)

## 2015-03-26 DIAGNOSIS — E1129 Type 2 diabetes mellitus with other diabetic kidney complication: Secondary | ICD-10-CM | POA: Diagnosis not present

## 2015-03-26 LAB — GLUCOSE, CAPILLARY
GLUCOSE-CAPILLARY: 268 mg/dL — AB (ref 65–99)
Glucose-Capillary: 130 mg/dL — ABNORMAL HIGH (ref 65–99)

## 2015-03-27 DIAGNOSIS — E1129 Type 2 diabetes mellitus with other diabetic kidney complication: Secondary | ICD-10-CM | POA: Diagnosis not present

## 2015-03-27 LAB — GLUCOSE, CAPILLARY
GLUCOSE-CAPILLARY: 150 mg/dL — AB (ref 65–99)
Glucose-Capillary: 98 mg/dL (ref 65–99)

## 2015-03-28 DIAGNOSIS — E1129 Type 2 diabetes mellitus with other diabetic kidney complication: Secondary | ICD-10-CM | POA: Diagnosis not present

## 2015-03-28 LAB — GLUCOSE, CAPILLARY
Glucose-Capillary: 102 mg/dL — ABNORMAL HIGH (ref 65–99)
Glucose-Capillary: 163 mg/dL — ABNORMAL HIGH (ref 65–99)

## 2015-03-29 DIAGNOSIS — E1129 Type 2 diabetes mellitus with other diabetic kidney complication: Secondary | ICD-10-CM | POA: Diagnosis not present

## 2015-03-29 LAB — GLUCOSE, CAPILLARY
Glucose-Capillary: 154 mg/dL — ABNORMAL HIGH (ref 65–99)
Glucose-Capillary: 164 mg/dL — ABNORMAL HIGH (ref 65–99)

## 2015-03-30 DIAGNOSIS — E1129 Type 2 diabetes mellitus with other diabetic kidney complication: Secondary | ICD-10-CM | POA: Diagnosis not present

## 2015-03-30 LAB — GLUCOSE, CAPILLARY
GLUCOSE-CAPILLARY: 204 mg/dL — AB (ref 65–99)
Glucose-Capillary: 110 mg/dL — ABNORMAL HIGH (ref 65–99)

## 2015-03-31 DIAGNOSIS — E1129 Type 2 diabetes mellitus with other diabetic kidney complication: Secondary | ICD-10-CM | POA: Diagnosis not present

## 2015-03-31 LAB — GLUCOSE, CAPILLARY
GLUCOSE-CAPILLARY: 63 mg/dL — AB (ref 65–99)
Glucose-Capillary: 80 mg/dL (ref 65–99)

## 2015-04-01 DIAGNOSIS — E1129 Type 2 diabetes mellitus with other diabetic kidney complication: Secondary | ICD-10-CM | POA: Diagnosis not present

## 2015-04-01 LAB — GLUCOSE, CAPILLARY
Glucose-Capillary: 134 mg/dL — ABNORMAL HIGH (ref 65–99)
Glucose-Capillary: 182 mg/dL — ABNORMAL HIGH (ref 65–99)

## 2015-04-02 DIAGNOSIS — E1129 Type 2 diabetes mellitus with other diabetic kidney complication: Secondary | ICD-10-CM | POA: Diagnosis not present

## 2015-04-02 LAB — GLUCOSE, CAPILLARY
GLUCOSE-CAPILLARY: 129 mg/dL — AB (ref 65–99)
GLUCOSE-CAPILLARY: 233 mg/dL — AB (ref 65–99)

## 2015-04-03 DIAGNOSIS — E1129 Type 2 diabetes mellitus with other diabetic kidney complication: Secondary | ICD-10-CM | POA: Diagnosis not present

## 2015-04-03 LAB — GLUCOSE, CAPILLARY
GLUCOSE-CAPILLARY: 164 mg/dL — AB (ref 65–99)
GLUCOSE-CAPILLARY: 344 mg/dL — AB (ref 65–99)
GLUCOSE-CAPILLARY: 336 mg/dL — AB (ref 65–99)

## 2015-04-04 DIAGNOSIS — E1129 Type 2 diabetes mellitus with other diabetic kidney complication: Secondary | ICD-10-CM | POA: Diagnosis not present

## 2015-04-04 LAB — GLUCOSE, CAPILLARY
GLUCOSE-CAPILLARY: 210 mg/dL — AB (ref 65–99)
Glucose-Capillary: 72 mg/dL (ref 65–99)

## 2015-04-05 DIAGNOSIS — E1129 Type 2 diabetes mellitus with other diabetic kidney complication: Secondary | ICD-10-CM | POA: Diagnosis not present

## 2015-04-05 LAB — GLUCOSE, CAPILLARY: GLUCOSE-CAPILLARY: 99 mg/dL (ref 65–99)

## 2015-04-06 DIAGNOSIS — E1129 Type 2 diabetes mellitus with other diabetic kidney complication: Secondary | ICD-10-CM | POA: Diagnosis not present

## 2015-04-06 LAB — GLUCOSE, CAPILLARY
GLUCOSE-CAPILLARY: 118 mg/dL — AB (ref 65–99)
Glucose-Capillary: 76 mg/dL (ref 65–99)
Glucose-Capillary: 133 mg/dL — ABNORMAL HIGH (ref 65–99)

## 2015-04-07 DIAGNOSIS — E1129 Type 2 diabetes mellitus with other diabetic kidney complication: Secondary | ICD-10-CM | POA: Diagnosis not present

## 2015-04-08 LAB — GLUCOSE, CAPILLARY
GLUCOSE-CAPILLARY: 199 mg/dL — AB (ref 65–99)
Glucose-Capillary: 99 mg/dL (ref 65–99)
Glucose-Capillary: 179 mg/dL — ABNORMAL HIGH (ref 65–99)
Glucose-Capillary: 144 mg/dL — ABNORMAL HIGH (ref 65–99)

## 2015-04-09 DIAGNOSIS — E1129 Type 2 diabetes mellitus with other diabetic kidney complication: Secondary | ICD-10-CM | POA: Diagnosis not present

## 2015-04-09 LAB — GLUCOSE, CAPILLARY
GLUCOSE-CAPILLARY: 119 mg/dL — AB (ref 65–99)
GLUCOSE-CAPILLARY: 255 mg/dL — AB (ref 65–99)

## 2015-04-10 DIAGNOSIS — E1129 Type 2 diabetes mellitus with other diabetic kidney complication: Secondary | ICD-10-CM | POA: Diagnosis not present

## 2015-04-10 LAB — GLUCOSE, CAPILLARY
GLUCOSE-CAPILLARY: 77 mg/dL (ref 65–99)
Glucose-Capillary: 117 mg/dL — ABNORMAL HIGH (ref 65–99)
Glucose-Capillary: 54 mg/dL — ABNORMAL LOW (ref 65–99)

## 2015-04-11 DIAGNOSIS — E1129 Type 2 diabetes mellitus with other diabetic kidney complication: Secondary | ICD-10-CM | POA: Diagnosis not present

## 2015-04-11 LAB — GLUCOSE, CAPILLARY
GLUCOSE-CAPILLARY: 160 mg/dL — AB (ref 65–99)
Glucose-Capillary: 211 mg/dL — ABNORMAL HIGH (ref 65–99)

## 2015-04-12 DIAGNOSIS — E1129 Type 2 diabetes mellitus with other diabetic kidney complication: Secondary | ICD-10-CM | POA: Diagnosis not present

## 2015-04-12 LAB — GLUCOSE, CAPILLARY
GLUCOSE-CAPILLARY: 84 mg/dL (ref 65–99)
GLUCOSE-CAPILLARY: 168 mg/dL — AB (ref 65–99)

## 2015-04-13 DIAGNOSIS — E1129 Type 2 diabetes mellitus with other diabetic kidney complication: Secondary | ICD-10-CM | POA: Diagnosis not present

## 2015-04-13 LAB — GLUCOSE, CAPILLARY: Glucose-Capillary: 141 mg/dL — ABNORMAL HIGH (ref 65–99)

## 2015-04-14 LAB — GLUCOSE, CAPILLARY
Glucose-Capillary: 181 mg/dL — ABNORMAL HIGH (ref 65–99)
Glucose-Capillary: 99 mg/dL (ref 65–99)
Glucose-Capillary: 116 mg/dL — ABNORMAL HIGH (ref 65–99)

## 2015-04-15 ENCOUNTER — Encounter
Admission: RE | Admit: 2015-04-15 | Discharge: 2015-04-15 | Disposition: A | Discharge status: Home / Self Care | Source: Ambulatory Visit | Attending: Internal Medicine | Admitting: Internal Medicine

## 2015-04-15 DIAGNOSIS — E1129 Type 2 diabetes mellitus with other diabetic kidney complication: Secondary | ICD-10-CM | POA: Diagnosis not present

## 2015-04-15 LAB — GLUCOSE, CAPILLARY
Glucose-Capillary: 62 mg/dL — ABNORMAL LOW (ref 65–99)
Glucose-Capillary: 152 mg/dL — ABNORMAL HIGH (ref 65–99)

## 2015-04-16 DIAGNOSIS — E1129 Type 2 diabetes mellitus with other diabetic kidney complication: Secondary | ICD-10-CM | POA: Diagnosis not present

## 2015-04-16 LAB — GLUCOSE, CAPILLARY
GLUCOSE-CAPILLARY: 136 mg/dL — AB (ref 65–99)
Glucose-Capillary: 132 mg/dL — ABNORMAL HIGH (ref 65–99)

## 2015-04-17 DIAGNOSIS — E1129 Type 2 diabetes mellitus with other diabetic kidney complication: Secondary | ICD-10-CM | POA: Diagnosis not present

## 2015-04-17 LAB — GLUCOSE, CAPILLARY
GLUCOSE-CAPILLARY: 91 mg/dL (ref 65–99)
GLUCOSE-CAPILLARY: 200 mg/dL — AB (ref 65–99)

## 2015-05-08 DIAGNOSIS — E1129 Type 2 diabetes mellitus with other diabetic kidney complication: Secondary | ICD-10-CM | POA: Diagnosis not present

## 2015-05-09 DIAGNOSIS — E1129 Type 2 diabetes mellitus with other diabetic kidney complication: Secondary | ICD-10-CM | POA: Diagnosis not present

## 2015-05-09 LAB — GLUCOSE, CAPILLARY
GLUCOSE-CAPILLARY: 134 mg/dL — AB (ref 65–99)
Glucose-Capillary: 138 mg/dL — ABNORMAL HIGH (ref 65–99)

## 2015-05-10 DIAGNOSIS — E1129 Type 2 diabetes mellitus with other diabetic kidney complication: Secondary | ICD-10-CM | POA: Diagnosis not present

## 2015-05-10 LAB — GLUCOSE, CAPILLARY
GLUCOSE-CAPILLARY: 66 mg/dL (ref 65–99)
GLUCOSE-CAPILLARY: 148 mg/dL — AB (ref 65–99)

## 2015-05-11 LAB — GLUCOSE, CAPILLARY
GLUCOSE-CAPILLARY: 140 mg/dL — AB (ref 65–99)
GLUCOSE-CAPILLARY: 116 mg/dL — AB (ref 65–99)
GLUCOSE-CAPILLARY: 111 mg/dL — AB (ref 65–99)
Glucose-Capillary: 150 mg/dL — ABNORMAL HIGH (ref 65–99)
Glucose-Capillary: 176 mg/dL — ABNORMAL HIGH (ref 65–99)
Glucose-Capillary: 93 mg/dL (ref 65–99)
Glucose-Capillary: 250 mg/dL — ABNORMAL HIGH (ref 65–99)
Glucose-Capillary: 128 mg/dL — ABNORMAL HIGH (ref 65–99)
Glucose-Capillary: 185 mg/dL — ABNORMAL HIGH (ref 65–99)

## 2015-05-12 DIAGNOSIS — E1129 Type 2 diabetes mellitus with other diabetic kidney complication: Secondary | ICD-10-CM | POA: Diagnosis not present

## 2015-05-12 LAB — GLUCOSE, CAPILLARY
GLUCOSE-CAPILLARY: 118 mg/dL — AB (ref 65–99)
Glucose-Capillary: 151 mg/dL — ABNORMAL HIGH (ref 65–99)

## 2015-05-13 DIAGNOSIS — E1129 Type 2 diabetes mellitus with other diabetic kidney complication: Secondary | ICD-10-CM | POA: Diagnosis not present

## 2015-05-13 LAB — GLUCOSE, CAPILLARY
GLUCOSE-CAPILLARY: 98 mg/dL (ref 65–99)
GLUCOSE-CAPILLARY: 77 mg/dL (ref 65–99)
Glucose-Capillary: 43 mg/dL — CL (ref 65–99)

## 2015-05-14 DIAGNOSIS — E1129 Type 2 diabetes mellitus with other diabetic kidney complication: Secondary | ICD-10-CM | POA: Diagnosis not present

## 2015-05-14 LAB — GLUCOSE, CAPILLARY
GLUCOSE-CAPILLARY: 114 mg/dL — AB (ref 65–99)
GLUCOSE-CAPILLARY: 137 mg/dL — AB (ref 65–99)

## 2015-05-15 DIAGNOSIS — E1129 Type 2 diabetes mellitus with other diabetic kidney complication: Secondary | ICD-10-CM | POA: Diagnosis not present

## 2015-05-15 LAB — GLUCOSE, CAPILLARY
GLUCOSE-CAPILLARY: 137 mg/dL — AB (ref 65–99)
GLUCOSE-CAPILLARY: 183 mg/dL — AB (ref 65–99)

## 2015-05-16 ENCOUNTER — Encounter
Admission: RE | Admit: 2015-05-16 | Discharge: 2015-05-16 | Disposition: A | Discharge status: Home / Self Care | Source: Ambulatory Visit | Attending: Internal Medicine | Admitting: Internal Medicine

## 2015-05-16 DIAGNOSIS — E1129 Type 2 diabetes mellitus with other diabetic kidney complication: Secondary | ICD-10-CM | POA: Diagnosis not present

## 2015-05-16 LAB — GLUCOSE, CAPILLARY: Glucose-Capillary: 220 mg/dL — ABNORMAL HIGH (ref 65–99)

## 2015-05-17 DIAGNOSIS — E1129 Type 2 diabetes mellitus with other diabetic kidney complication: Secondary | ICD-10-CM | POA: Diagnosis not present

## 2015-05-17 LAB — GLUCOSE, CAPILLARY
GLUCOSE-CAPILLARY: 231 mg/dL — AB (ref 65–99)
Glucose-Capillary: 119 mg/dL — ABNORMAL HIGH (ref 65–99)

## 2015-05-18 DIAGNOSIS — E1129 Type 2 diabetes mellitus with other diabetic kidney complication: Secondary | ICD-10-CM | POA: Diagnosis not present

## 2015-05-18 LAB — GLUCOSE, CAPILLARY
GLUCOSE-CAPILLARY: 96 mg/dL (ref 65–99)
Glucose-Capillary: 71 mg/dL (ref 65–99)

## 2015-05-19 DIAGNOSIS — E1129 Type 2 diabetes mellitus with other diabetic kidney complication: Secondary | ICD-10-CM | POA: Diagnosis not present

## 2015-05-19 LAB — GLUCOSE, CAPILLARY
Glucose-Capillary: 127 mg/dL — ABNORMAL HIGH (ref 65–99)
Glucose-Capillary: 204 mg/dL — ABNORMAL HIGH (ref 65–99)

## 2015-05-20 DIAGNOSIS — E1129 Type 2 diabetes mellitus with other diabetic kidney complication: Secondary | ICD-10-CM | POA: Diagnosis not present

## 2015-05-20 LAB — GLUCOSE, CAPILLARY
Glucose-Capillary: 112 mg/dL — ABNORMAL HIGH (ref 65–99)
Glucose-Capillary: 150 mg/dL — ABNORMAL HIGH (ref 65–99)

## 2015-05-21 DIAGNOSIS — E1129 Type 2 diabetes mellitus with other diabetic kidney complication: Secondary | ICD-10-CM | POA: Diagnosis not present

## 2015-05-21 LAB — GLUCOSE, CAPILLARY
GLUCOSE-CAPILLARY: 188 mg/dL — AB (ref 65–99)
Glucose-Capillary: 82 mg/dL (ref 65–99)

## 2015-05-22 DIAGNOSIS — E1129 Type 2 diabetes mellitus with other diabetic kidney complication: Secondary | ICD-10-CM | POA: Diagnosis not present

## 2015-05-22 LAB — GLUCOSE, CAPILLARY
GLUCOSE-CAPILLARY: 72 mg/dL (ref 65–99)
Glucose-Capillary: 107 mg/dL — ABNORMAL HIGH (ref 65–99)

## 2015-05-23 DIAGNOSIS — E1129 Type 2 diabetes mellitus with other diabetic kidney complication: Secondary | ICD-10-CM | POA: Diagnosis not present

## 2015-05-23 LAB — GLUCOSE, CAPILLARY
GLUCOSE-CAPILLARY: 159 mg/dL — AB (ref 65–99)
Glucose-Capillary: 153 mg/dL — ABNORMAL HIGH (ref 65–99)

## 2015-05-24 DIAGNOSIS — E1129 Type 2 diabetes mellitus with other diabetic kidney complication: Secondary | ICD-10-CM | POA: Diagnosis not present

## 2015-05-24 LAB — GLUCOSE, CAPILLARY
GLUCOSE-CAPILLARY: 134 mg/dL — AB (ref 65–99)
GLUCOSE-CAPILLARY: 173 mg/dL — AB (ref 65–99)

## 2015-05-25 DIAGNOSIS — E1129 Type 2 diabetes mellitus with other diabetic kidney complication: Secondary | ICD-10-CM | POA: Diagnosis not present

## 2015-05-25 LAB — GLUCOSE, CAPILLARY: Glucose-Capillary: 153 mg/dL — ABNORMAL HIGH (ref 65–99)

## 2015-05-26 DIAGNOSIS — E1129 Type 2 diabetes mellitus with other diabetic kidney complication: Secondary | ICD-10-CM | POA: Diagnosis not present

## 2015-05-26 LAB — GLUCOSE, CAPILLARY
GLUCOSE-CAPILLARY: 49 mg/dL — AB (ref 65–99)
GLUCOSE-CAPILLARY: 74 mg/dL (ref 65–99)
Glucose-Capillary: 116 mg/dL — ABNORMAL HIGH (ref 65–99)

## 2015-05-27 DIAGNOSIS — E1129 Type 2 diabetes mellitus with other diabetic kidney complication: Secondary | ICD-10-CM | POA: Diagnosis not present

## 2015-05-27 LAB — GLUCOSE, CAPILLARY
GLUCOSE-CAPILLARY: 173 mg/dL — AB (ref 65–99)
Glucose-Capillary: 138 mg/dL — ABNORMAL HIGH (ref 65–99)

## 2015-05-28 DIAGNOSIS — E1129 Type 2 diabetes mellitus with other diabetic kidney complication: Secondary | ICD-10-CM | POA: Diagnosis not present

## 2015-05-28 LAB — GLUCOSE, CAPILLARY
GLUCOSE-CAPILLARY: 211 mg/dL — AB (ref 65–99)
Glucose-Capillary: 110 mg/dL — ABNORMAL HIGH (ref 65–99)

## 2015-05-29 DIAGNOSIS — E1129 Type 2 diabetes mellitus with other diabetic kidney complication: Secondary | ICD-10-CM | POA: Diagnosis not present

## 2015-05-29 LAB — GLUCOSE, CAPILLARY
GLUCOSE-CAPILLARY: 112 mg/dL — AB (ref 65–99)
Glucose-Capillary: 129 mg/dL — ABNORMAL HIGH (ref 65–99)

## 2015-05-30 DIAGNOSIS — E1129 Type 2 diabetes mellitus with other diabetic kidney complication: Secondary | ICD-10-CM | POA: Diagnosis not present

## 2015-05-30 LAB — GLUCOSE, CAPILLARY
GLUCOSE-CAPILLARY: 169 mg/dL — AB (ref 65–99)
Glucose-Capillary: 172 mg/dL — ABNORMAL HIGH (ref 65–99)

## 2015-05-31 DIAGNOSIS — E1129 Type 2 diabetes mellitus with other diabetic kidney complication: Secondary | ICD-10-CM | POA: Diagnosis not present

## 2015-05-31 LAB — GLUCOSE, CAPILLARY
Glucose-Capillary: 112 mg/dL — ABNORMAL HIGH (ref 65–99)
Glucose-Capillary: 37 mg/dL — CL (ref 65–99)
Glucose-Capillary: 77 mg/dL (ref 65–99)

## 2015-06-01 DIAGNOSIS — E1129 Type 2 diabetes mellitus with other diabetic kidney complication: Secondary | ICD-10-CM | POA: Diagnosis not present

## 2015-06-01 LAB — GLUCOSE, CAPILLARY
Glucose-Capillary: 152 mg/dL — ABNORMAL HIGH (ref 65–99)
Glucose-Capillary: 139 mg/dL — ABNORMAL HIGH (ref 65–99)

## 2015-06-02 DIAGNOSIS — E1129 Type 2 diabetes mellitus with other diabetic kidney complication: Secondary | ICD-10-CM | POA: Diagnosis not present

## 2015-06-02 LAB — GLUCOSE, CAPILLARY: GLUCOSE-CAPILLARY: 100 mg/dL — AB (ref 65–99)

## 2015-06-03 DIAGNOSIS — E1129 Type 2 diabetes mellitus with other diabetic kidney complication: Secondary | ICD-10-CM | POA: Diagnosis not present

## 2015-06-03 LAB — GLUCOSE, CAPILLARY
Glucose-Capillary: 88 mg/dL (ref 65–99)
Glucose-Capillary: 253 mg/dL — ABNORMAL HIGH (ref 65–99)

## 2015-06-04 DIAGNOSIS — E1129 Type 2 diabetes mellitus with other diabetic kidney complication: Secondary | ICD-10-CM | POA: Diagnosis not present

## 2015-06-04 LAB — GLUCOSE, CAPILLARY
Glucose-Capillary: 133 mg/dL — ABNORMAL HIGH (ref 65–99)
Glucose-Capillary: 72 mg/dL (ref 65–99)

## 2015-06-05 DIAGNOSIS — E1129 Type 2 diabetes mellitus with other diabetic kidney complication: Secondary | ICD-10-CM | POA: Diagnosis not present

## 2015-06-05 LAB — GLUCOSE, CAPILLARY
GLUCOSE-CAPILLARY: 101 mg/dL — AB (ref 65–99)
GLUCOSE-CAPILLARY: 143 mg/dL — AB (ref 65–99)

## 2015-06-06 DIAGNOSIS — E1129 Type 2 diabetes mellitus with other diabetic kidney complication: Secondary | ICD-10-CM | POA: Diagnosis not present

## 2015-06-06 LAB — GLUCOSE, CAPILLARY
Glucose-Capillary: 195 mg/dL — ABNORMAL HIGH (ref 65–99)
Glucose-Capillary: 210 mg/dL — ABNORMAL HIGH (ref 65–99)

## 2015-06-07 DIAGNOSIS — E1129 Type 2 diabetes mellitus with other diabetic kidney complication: Secondary | ICD-10-CM | POA: Diagnosis not present

## 2015-06-07 LAB — GLUCOSE, CAPILLARY
GLUCOSE-CAPILLARY: 153 mg/dL — AB (ref 65–99)
Glucose-Capillary: 139 mg/dL — ABNORMAL HIGH (ref 65–99)

## 2015-06-08 DIAGNOSIS — E1129 Type 2 diabetes mellitus with other diabetic kidney complication: Secondary | ICD-10-CM | POA: Diagnosis not present

## 2015-06-08 LAB — GLUCOSE, CAPILLARY
GLUCOSE-CAPILLARY: 117 mg/dL — AB (ref 65–99)
GLUCOSE-CAPILLARY: 132 mg/dL — AB (ref 65–99)

## 2015-06-09 DIAGNOSIS — E1129 Type 2 diabetes mellitus with other diabetic kidney complication: Secondary | ICD-10-CM | POA: Diagnosis present

## 2015-06-09 LAB — GLUCOSE, CAPILLARY
GLUCOSE-CAPILLARY: 65 mg/dL (ref 65–99)
GLUCOSE-CAPILLARY: 212 mg/dL — AB (ref 65–99)

## 2015-06-10 DIAGNOSIS — E1129 Type 2 diabetes mellitus with other diabetic kidney complication: Secondary | ICD-10-CM | POA: Diagnosis not present

## 2015-06-10 LAB — GLUCOSE, CAPILLARY
GLUCOSE-CAPILLARY: 125 mg/dL — AB (ref 65–99)
Glucose-Capillary: 104 mg/dL — ABNORMAL HIGH (ref 65–99)

## 2015-06-11 DIAGNOSIS — E1129 Type 2 diabetes mellitus with other diabetic kidney complication: Secondary | ICD-10-CM | POA: Diagnosis not present

## 2015-06-11 LAB — GLUCOSE, CAPILLARY: Glucose-Capillary: 204 mg/dL — ABNORMAL HIGH (ref 65–99)

## 2015-06-12 DIAGNOSIS — E1129 Type 2 diabetes mellitus with other diabetic kidney complication: Secondary | ICD-10-CM | POA: Diagnosis not present

## 2015-06-12 LAB — GLUCOSE, CAPILLARY
GLUCOSE-CAPILLARY: 126 mg/dL — AB (ref 65–99)
Glucose-Capillary: 185 mg/dL — ABNORMAL HIGH (ref 65–99)

## 2015-06-13 DIAGNOSIS — E1129 Type 2 diabetes mellitus with other diabetic kidney complication: Secondary | ICD-10-CM | POA: Diagnosis not present

## 2015-06-13 LAB — GLUCOSE, CAPILLARY: Glucose-Capillary: 243 mg/dL — ABNORMAL HIGH (ref 65–99)

## 2015-06-14 DIAGNOSIS — E1129 Type 2 diabetes mellitus with other diabetic kidney complication: Secondary | ICD-10-CM | POA: Diagnosis not present

## 2015-06-14 LAB — GLUCOSE, CAPILLARY
GLUCOSE-CAPILLARY: 142 mg/dL — AB (ref 65–99)
Glucose-Capillary: 84 mg/dL (ref 65–99)

## 2015-06-15 ENCOUNTER — Encounter
Admission: RE | Admit: 2015-06-15 | Discharge: 2015-06-15 | Disposition: A | Discharge status: Home / Self Care | Source: Ambulatory Visit | Attending: Internal Medicine | Admitting: Internal Medicine

## 2015-06-15 DIAGNOSIS — E1129 Type 2 diabetes mellitus with other diabetic kidney complication: Secondary | ICD-10-CM | POA: Insufficient documentation

## 2015-06-15 LAB — GLUCOSE, CAPILLARY
GLUCOSE-CAPILLARY: 142 mg/dL — AB (ref 65–99)
GLUCOSE-CAPILLARY: 95 mg/dL (ref 65–99)

## 2015-06-26 DIAGNOSIS — E1129 Type 2 diabetes mellitus with other diabetic kidney complication: Secondary | ICD-10-CM | POA: Diagnosis not present

## 2015-06-26 LAB — GLUCOSE, CAPILLARY
GLUCOSE-CAPILLARY: 194 mg/dL — AB (ref 65–99)
GLUCOSE-CAPILLARY: 159 mg/dL — AB (ref 65–99)
GLUCOSE-CAPILLARY: 106 mg/dL — AB (ref 65–99)
GLUCOSE-CAPILLARY: 130 mg/dL — AB (ref 65–99)
Glucose-Capillary: 95 mg/dL (ref 65–99)
Glucose-Capillary: 197 mg/dL — ABNORMAL HIGH (ref 65–99)
Glucose-Capillary: 95 mg/dL (ref 65–99)
Glucose-Capillary: 108 mg/dL — ABNORMAL HIGH (ref 65–99)

## 2015-07-07 DIAGNOSIS — E1129 Type 2 diabetes mellitus with other diabetic kidney complication: Secondary | ICD-10-CM | POA: Diagnosis not present

## 2015-07-07 LAB — GLUCOSE, CAPILLARY
GLUCOSE-CAPILLARY: 92 mg/dL (ref 65–99)
GLUCOSE-CAPILLARY: 153 mg/dL — AB (ref 65–99)

## 2015-07-08 DIAGNOSIS — E1129 Type 2 diabetes mellitus with other diabetic kidney complication: Secondary | ICD-10-CM | POA: Diagnosis not present

## 2015-07-08 LAB — GLUCOSE, CAPILLARY
GLUCOSE-CAPILLARY: 67 mg/dL (ref 65–99)
GLUCOSE-CAPILLARY: 99 mg/dL (ref 65–99)

## 2015-07-09 DIAGNOSIS — E1129 Type 2 diabetes mellitus with other diabetic kidney complication: Secondary | ICD-10-CM | POA: Diagnosis not present

## 2015-07-09 LAB — GLUCOSE, CAPILLARY
GLUCOSE-CAPILLARY: 118 mg/dL — AB (ref 65–99)
GLUCOSE-CAPILLARY: 143 mg/dL — AB (ref 65–99)

## 2015-07-10 DIAGNOSIS — E1129 Type 2 diabetes mellitus with other diabetic kidney complication: Secondary | ICD-10-CM | POA: Diagnosis not present

## 2015-07-10 LAB — GLUCOSE, CAPILLARY
GLUCOSE-CAPILLARY: 273 mg/dL — AB (ref 65–99)
Glucose-Capillary: 89 mg/dL (ref 65–99)

## 2015-07-11 DIAGNOSIS — E1129 Type 2 diabetes mellitus with other diabetic kidney complication: Secondary | ICD-10-CM | POA: Diagnosis not present

## 2015-07-11 LAB — GLUCOSE, CAPILLARY
Glucose-Capillary: 70 mg/dL (ref 65–99)
Glucose-Capillary: 322 mg/dL — ABNORMAL HIGH (ref 65–99)

## 2015-07-12 DIAGNOSIS — E1129 Type 2 diabetes mellitus with other diabetic kidney complication: Secondary | ICD-10-CM | POA: Diagnosis not present

## 2015-07-12 LAB — GLUCOSE, CAPILLARY: GLUCOSE-CAPILLARY: 190 mg/dL — AB (ref 65–99)

## 2015-07-13 DIAGNOSIS — E1129 Type 2 diabetes mellitus with other diabetic kidney complication: Secondary | ICD-10-CM | POA: Diagnosis not present

## 2015-07-13 LAB — GLUCOSE, CAPILLARY
Glucose-Capillary: 177 mg/dL — ABNORMAL HIGH (ref 65–99)
Glucose-Capillary: 206 mg/dL — ABNORMAL HIGH (ref 65–99)

## 2015-07-14 DIAGNOSIS — E1129 Type 2 diabetes mellitus with other diabetic kidney complication: Secondary | ICD-10-CM | POA: Diagnosis not present

## 2015-07-14 LAB — URINALYSIS COMPLETE WITH MICROSCOPIC (ARMC ONLY)
BILIRUBIN URINE: NEGATIVE
Bacteria, UA: NONE SEEN
GLUCOSE, UA: NEGATIVE mg/dL
Hgb urine dipstick: NEGATIVE
Ketones, ur: NEGATIVE mg/dL
Leukocytes, UA: NEGATIVE
Nitrite: NEGATIVE
PH: 5 (ref 5.0–8.0)
Protein, ur: NEGATIVE mg/dL
RBC / HPF: NONE SEEN RBC/hpf (ref 0–5)
Specific Gravity, Urine: 1.01 (ref 1.005–1.030)

## 2015-07-14 LAB — GLUCOSE, CAPILLARY
GLUCOSE-CAPILLARY: 86 mg/dL (ref 65–99)
GLUCOSE-CAPILLARY: 118 mg/dL — AB (ref 65–99)

## 2015-07-15 DIAGNOSIS — E1129 Type 2 diabetes mellitus with other diabetic kidney complication: Secondary | ICD-10-CM | POA: Diagnosis not present

## 2015-07-15 LAB — GLUCOSE, CAPILLARY
GLUCOSE-CAPILLARY: 122 mg/dL — AB (ref 65–99)
Glucose-Capillary: 203 mg/dL — ABNORMAL HIGH (ref 65–99)

## 2015-07-16 ENCOUNTER — Encounter
Admission: RE | Admit: 2015-07-16 | Discharge: 2015-07-16 | Disposition: A | Discharge status: Home / Self Care | Source: Ambulatory Visit | Attending: Internal Medicine | Admitting: Internal Medicine

## 2015-07-16 DIAGNOSIS — E1129 Type 2 diabetes mellitus with other diabetic kidney complication: Secondary | ICD-10-CM | POA: Insufficient documentation

## 2015-07-16 LAB — GLUCOSE, CAPILLARY: GLUCOSE-CAPILLARY: 214 mg/dL — AB (ref 65–99)

## 2015-07-16 LAB — URINE CULTURE: CULTURE: NO GROWTH

## 2015-07-17 DIAGNOSIS — E1129 Type 2 diabetes mellitus with other diabetic kidney complication: Secondary | ICD-10-CM | POA: Diagnosis not present

## 2015-07-17 LAB — GLUCOSE, CAPILLARY: Glucose-Capillary: 87 mg/dL (ref 65–99)

## 2015-07-18 DIAGNOSIS — E1129 Type 2 diabetes mellitus with other diabetic kidney complication: Secondary | ICD-10-CM | POA: Diagnosis not present

## 2015-07-18 LAB — GLUCOSE, CAPILLARY
Glucose-Capillary: 85 mg/dL (ref 65–99)
Glucose-Capillary: 129 mg/dL — ABNORMAL HIGH (ref 65–99)
Glucose-Capillary: 110 mg/dL — ABNORMAL HIGH (ref 65–99)

## 2015-07-19 DIAGNOSIS — E1129 Type 2 diabetes mellitus with other diabetic kidney complication: Secondary | ICD-10-CM | POA: Diagnosis not present

## 2015-07-19 LAB — GLUCOSE, CAPILLARY
GLUCOSE-CAPILLARY: 97 mg/dL (ref 65–99)
Glucose-Capillary: 107 mg/dL — ABNORMAL HIGH (ref 65–99)

## 2015-07-20 DIAGNOSIS — E1129 Type 2 diabetes mellitus with other diabetic kidney complication: Secondary | ICD-10-CM | POA: Diagnosis not present

## 2015-07-20 LAB — GLUCOSE, CAPILLARY
Glucose-Capillary: 131 mg/dL — ABNORMAL HIGH (ref 65–99)
Glucose-Capillary: 183 mg/dL — ABNORMAL HIGH (ref 65–99)

## 2015-07-21 DIAGNOSIS — E1129 Type 2 diabetes mellitus with other diabetic kidney complication: Secondary | ICD-10-CM | POA: Diagnosis not present

## 2015-07-21 LAB — GLUCOSE, CAPILLARY
Glucose-Capillary: 60 mg/dL — ABNORMAL LOW (ref 65–99)
Glucose-Capillary: 205 mg/dL — ABNORMAL HIGH (ref 65–99)

## 2015-07-22 DIAGNOSIS — E1129 Type 2 diabetes mellitus with other diabetic kidney complication: Secondary | ICD-10-CM | POA: Diagnosis not present

## 2015-07-22 LAB — GLUCOSE, CAPILLARY
Glucose-Capillary: 138 mg/dL — ABNORMAL HIGH (ref 65–99)
Glucose-Capillary: 160 mg/dL — ABNORMAL HIGH (ref 65–99)

## 2015-07-23 DIAGNOSIS — E1129 Type 2 diabetes mellitus with other diabetic kidney complication: Secondary | ICD-10-CM | POA: Diagnosis not present

## 2015-07-23 LAB — GLUCOSE, CAPILLARY
GLUCOSE-CAPILLARY: 155 mg/dL — AB (ref 65–99)
Glucose-Capillary: 171 mg/dL — ABNORMAL HIGH (ref 65–99)

## 2015-07-24 DIAGNOSIS — E1129 Type 2 diabetes mellitus with other diabetic kidney complication: Secondary | ICD-10-CM | POA: Diagnosis not present

## 2015-07-24 LAB — GLUCOSE, CAPILLARY
Glucose-Capillary: 135 mg/dL — ABNORMAL HIGH (ref 65–99)
Glucose-Capillary: 95 mg/dL (ref 65–99)

## 2015-07-25 DIAGNOSIS — E1129 Type 2 diabetes mellitus with other diabetic kidney complication: Secondary | ICD-10-CM | POA: Diagnosis not present

## 2015-07-25 LAB — GLUCOSE, CAPILLARY: Glucose-Capillary: 104 mg/dL — ABNORMAL HIGH (ref 65–99)

## 2015-07-26 DIAGNOSIS — E1129 Type 2 diabetes mellitus with other diabetic kidney complication: Secondary | ICD-10-CM | POA: Diagnosis not present

## 2015-07-26 LAB — URINALYSIS COMPLETE WITH MICROSCOPIC (ARMC ONLY)
BILIRUBIN URINE: NEGATIVE
Glucose, UA: NEGATIVE mg/dL
HGB URINE DIPSTICK: NEGATIVE
Ketones, ur: NEGATIVE mg/dL
NITRITE: NEGATIVE
PH: 7 (ref 5.0–8.0)
Protein, ur: NEGATIVE mg/dL
SPECIFIC GRAVITY, URINE: 1.017 (ref 1.005–1.030)

## 2015-07-26 LAB — GLUCOSE, CAPILLARY
GLUCOSE-CAPILLARY: 122 mg/dL — AB (ref 65–99)
Glucose-Capillary: 180 mg/dL — ABNORMAL HIGH (ref 65–99)

## 2015-07-27 DIAGNOSIS — E1129 Type 2 diabetes mellitus with other diabetic kidney complication: Secondary | ICD-10-CM | POA: Diagnosis not present

## 2015-07-27 LAB — URINE CULTURE

## 2015-07-27 LAB — GLUCOSE, CAPILLARY: Glucose-Capillary: 162 mg/dL — ABNORMAL HIGH (ref 65–99)

## 2015-07-28 DIAGNOSIS — E1129 Type 2 diabetes mellitus with other diabetic kidney complication: Secondary | ICD-10-CM | POA: Diagnosis not present

## 2015-07-28 LAB — GLUCOSE, CAPILLARY
GLUCOSE-CAPILLARY: 122 mg/dL — AB (ref 65–99)
Glucose-Capillary: 204 mg/dL — ABNORMAL HIGH (ref 65–99)

## 2015-07-29 DIAGNOSIS — E1129 Type 2 diabetes mellitus with other diabetic kidney complication: Secondary | ICD-10-CM | POA: Diagnosis not present

## 2015-07-29 LAB — GLUCOSE, CAPILLARY: Glucose-Capillary: 151 mg/dL — ABNORMAL HIGH (ref 65–99)

## 2015-07-30 DIAGNOSIS — E1129 Type 2 diabetes mellitus with other diabetic kidney complication: Secondary | ICD-10-CM | POA: Diagnosis not present

## 2015-07-30 LAB — GLUCOSE, CAPILLARY: GLUCOSE-CAPILLARY: 85 mg/dL (ref 65–99)

## 2015-07-31 DIAGNOSIS — E1129 Type 2 diabetes mellitus with other diabetic kidney complication: Secondary | ICD-10-CM | POA: Diagnosis not present

## 2015-07-31 LAB — URINALYSIS COMPLETE WITH MICROSCOPIC (ARMC ONLY)
BILIRUBIN URINE: NEGATIVE
GLUCOSE, UA: NEGATIVE mg/dL
KETONES UR: NEGATIVE mg/dL
Nitrite: NEGATIVE
Protein, ur: NEGATIVE mg/dL
SPECIFIC GRAVITY, URINE: 1.012 (ref 1.005–1.030)
pH: 5 (ref 5.0–8.0)

## 2015-07-31 LAB — CBC WITH DIFFERENTIAL/PLATELET
Basophils Absolute: 0 10*3/uL (ref 0–0.1)
EOS ABS: 0.3 10*3/uL (ref 0–0.7)
HCT: 36.9 % (ref 35.0–47.0)
HEMOGLOBIN: 12.4 g/dL (ref 12.0–16.0)
LYMPHS ABS: 4.4 10*3/uL — AB (ref 1.0–3.6)
Lymphocytes Relative: 32 %
MCH: 32.1 pg (ref 26.0–34.0)
MCHC: 33.5 g/dL (ref 32.0–36.0)
MCV: 95.8 fL (ref 80.0–100.0)
MONO ABS: 0.6 10*3/uL (ref 0.2–0.9)
Neutro Abs: 8.4 10*3/uL — ABNORMAL HIGH (ref 1.4–6.5)
Neutrophils Relative %: 62 %
PLATELETS: 282 10*3/uL (ref 150–440)
RBC: 3.85 MIL/uL (ref 3.80–5.20)
RDW: 13.6 % (ref 11.5–14.5)
WBC: 13.7 10*3/uL — ABNORMAL HIGH (ref 3.6–11.0)

## 2015-07-31 LAB — BASIC METABOLIC PANEL
Anion gap: 9 (ref 5–15)
BUN: 21 mg/dL — ABNORMAL HIGH (ref 6–20)
CALCIUM: 9.5 mg/dL (ref 8.9–10.3)
CHLORIDE: 101 mmol/L (ref 101–111)
CO2: 30 mmol/L (ref 22–32)
CREATININE: 0.83 mg/dL (ref 0.44–1.00)
Glucose, Bld: 57 mg/dL — ABNORMAL LOW (ref 65–99)
Potassium: 3.4 mmol/L — ABNORMAL LOW (ref 3.5–5.1)
SODIUM: 140 mmol/L (ref 135–145)

## 2015-07-31 LAB — GLUCOSE, CAPILLARY
Glucose-Capillary: 105 mg/dL — ABNORMAL HIGH (ref 65–99)
Glucose-Capillary: 201 mg/dL — ABNORMAL HIGH (ref 65–99)

## 2015-08-01 DIAGNOSIS — E1129 Type 2 diabetes mellitus with other diabetic kidney complication: Secondary | ICD-10-CM | POA: Diagnosis not present

## 2015-08-01 LAB — URINE CULTURE

## 2015-08-01 LAB — GLUCOSE, CAPILLARY: Glucose-Capillary: 119 mg/dL — ABNORMAL HIGH (ref 65–99)

## 2015-08-02 DIAGNOSIS — E1129 Type 2 diabetes mellitus with other diabetic kidney complication: Secondary | ICD-10-CM | POA: Diagnosis not present

## 2015-08-02 LAB — GLUCOSE, CAPILLARY
Glucose-Capillary: 93 mg/dL (ref 65–99)
Glucose-Capillary: 164 mg/dL — ABNORMAL HIGH (ref 65–99)

## 2015-08-03 DIAGNOSIS — E1129 Type 2 diabetes mellitus with other diabetic kidney complication: Secondary | ICD-10-CM | POA: Diagnosis not present

## 2015-08-03 LAB — GLUCOSE, CAPILLARY: GLUCOSE-CAPILLARY: 97 mg/dL (ref 65–99)

## 2015-08-04 DIAGNOSIS — E1129 Type 2 diabetes mellitus with other diabetic kidney complication: Secondary | ICD-10-CM | POA: Diagnosis not present

## 2015-08-04 LAB — GLUCOSE, CAPILLARY
GLUCOSE-CAPILLARY: 58 mg/dL — AB (ref 65–99)
GLUCOSE-CAPILLARY: 302 mg/dL — AB (ref 65–99)

## 2015-08-05 DIAGNOSIS — E1129 Type 2 diabetes mellitus with other diabetic kidney complication: Secondary | ICD-10-CM | POA: Diagnosis not present

## 2015-08-05 LAB — GLUCOSE, CAPILLARY: GLUCOSE-CAPILLARY: 128 mg/dL — AB (ref 65–99)

## 2015-08-06 DIAGNOSIS — E1129 Type 2 diabetes mellitus with other diabetic kidney complication: Secondary | ICD-10-CM | POA: Diagnosis not present

## 2015-08-06 LAB — GLUCOSE, CAPILLARY
GLUCOSE-CAPILLARY: 151 mg/dL — AB (ref 65–99)
GLUCOSE-CAPILLARY: 149 mg/dL — AB (ref 65–99)

## 2015-08-07 DIAGNOSIS — E1129 Type 2 diabetes mellitus with other diabetic kidney complication: Secondary | ICD-10-CM | POA: Diagnosis not present

## 2015-08-07 LAB — GLUCOSE, CAPILLARY
Glucose-Capillary: 134 mg/dL — ABNORMAL HIGH (ref 65–99)
Glucose-Capillary: 127 mg/dL — ABNORMAL HIGH (ref 65–99)

## 2015-08-08 DIAGNOSIS — E1129 Type 2 diabetes mellitus with other diabetic kidney complication: Secondary | ICD-10-CM | POA: Diagnosis present

## 2015-08-08 LAB — GLUCOSE, CAPILLARY
GLUCOSE-CAPILLARY: 171 mg/dL — AB (ref 65–99)
GLUCOSE-CAPILLARY: 180 mg/dL — AB (ref 65–99)

## 2015-08-09 DIAGNOSIS — E1129 Type 2 diabetes mellitus with other diabetic kidney complication: Secondary | ICD-10-CM | POA: Diagnosis not present

## 2015-08-09 LAB — GLUCOSE, CAPILLARY
GLUCOSE-CAPILLARY: 106 mg/dL — AB (ref 65–99)
Glucose-Capillary: 105 mg/dL — ABNORMAL HIGH (ref 65–99)

## 2015-08-10 DIAGNOSIS — E1129 Type 2 diabetes mellitus with other diabetic kidney complication: Secondary | ICD-10-CM | POA: Diagnosis not present

## 2015-08-10 LAB — GLUCOSE, CAPILLARY
GLUCOSE-CAPILLARY: 93 mg/dL (ref 65–99)
Glucose-Capillary: 114 mg/dL — ABNORMAL HIGH (ref 65–99)

## 2015-08-11 DIAGNOSIS — E1129 Type 2 diabetes mellitus with other diabetic kidney complication: Secondary | ICD-10-CM | POA: Diagnosis not present

## 2015-08-11 LAB — GLUCOSE, CAPILLARY
GLUCOSE-CAPILLARY: 79 mg/dL (ref 65–99)
GLUCOSE-CAPILLARY: 229 mg/dL — AB (ref 65–99)

## 2015-08-12 DIAGNOSIS — E1129 Type 2 diabetes mellitus with other diabetic kidney complication: Secondary | ICD-10-CM | POA: Diagnosis not present

## 2015-08-12 LAB — GLUCOSE, CAPILLARY
Glucose-Capillary: 167 mg/dL — ABNORMAL HIGH (ref 65–99)
Glucose-Capillary: 193 mg/dL — ABNORMAL HIGH (ref 65–99)

## 2015-08-13 DIAGNOSIS — E1129 Type 2 diabetes mellitus with other diabetic kidney complication: Secondary | ICD-10-CM | POA: Diagnosis not present

## 2015-08-13 LAB — GLUCOSE, CAPILLARY
GLUCOSE-CAPILLARY: 98 mg/dL (ref 65–99)
Glucose-Capillary: 102 mg/dL — ABNORMAL HIGH (ref 65–99)

## 2015-08-14 DIAGNOSIS — E1129 Type 2 diabetes mellitus with other diabetic kidney complication: Secondary | ICD-10-CM | POA: Diagnosis not present

## 2015-08-14 LAB — GLUCOSE, CAPILLARY
Glucose-Capillary: 75 mg/dL (ref 65–99)
Glucose-Capillary: 221 mg/dL — ABNORMAL HIGH (ref 65–99)
Glucose-Capillary: 230 mg/dL — ABNORMAL HIGH (ref 65–99)

## 2015-08-15 ENCOUNTER — Encounter
Admission: RE | Admit: 2015-08-15 | Discharge: 2015-08-15 | Disposition: A | Discharge status: Home / Self Care | Source: Ambulatory Visit | Attending: Internal Medicine | Admitting: Internal Medicine

## 2015-08-15 DIAGNOSIS — E1129 Type 2 diabetes mellitus with other diabetic kidney complication: Secondary | ICD-10-CM | POA: Insufficient documentation

## 2015-08-15 LAB — GLUCOSE, CAPILLARY
GLUCOSE-CAPILLARY: 222 mg/dL — AB (ref 65–99)
Glucose-Capillary: 81 mg/dL (ref 65–99)

## 2015-08-31 ENCOUNTER — Non-Acute Institutional Stay (SKILLED_NURSING_FACILITY): Payer: Medicare Other | Admitting: Gerontology

## 2015-08-31 DIAGNOSIS — F0390 Unspecified dementia without behavioral disturbance: Secondary | ICD-10-CM | POA: Diagnosis not present

## 2015-08-31 NOTE — Progress Notes (Signed)
Location:  [place] [facility] Service:  SNF [phone]) Provider:  Lorenso Quarry, NP-C  Lauro Regulus., MD  Patient Care Team: Lauro Regulus, MD as PCP - General (Internal Medicine)  Extended Emergency Contact Information Primary Emergency Contact: Rudd,Robin Address: [address]          [place], [place] [address] [place] Home Phone: [phone] Relation: Daughter Secondary Emergency Contact: Cecilie Kicks Address: [address] [place], [place] [address] [place] of [place] Home Phone: [phone] Relation: Daughter  Code Status:  DNR Goals of care: Advanced Directive information Advanced Directives 03/18/2015  Does patient have an advance directive? Yes  Type of Advance Directive Out of facility DNR (pink MOST or yellow form)     Chief Complaint  Patient presents with  . Medical Management of Chronic Issues    HPI:  Pt is a 82 y.o. female seen today for medical management of chronic diseases. She has Dementia, without Behavioral Disturbance. She has had a slow decline over the past few months. She is no longer oriented. She was telling me she is not in school because school is out on vacation. She wasn't going to eat lunch, so does she still have to pay for it, etc. Recently she has been "seeing" rats on her bed that have scared her, but no mention of the rats today. She is calm and pleasant. Unable to obtain a complete ROS d/t pt's dementia.   Past Medical History  Diagnosis Date  . Diabetes mellitus without complication (HCC)   . Hypertension   . COPD (chronic obstructive pulmonary disease) (HCC)   . Coronary artery disease   . Hyperlipidemia   . Renal disorder   . Collagen vascular disease [facility])    Past Surgical History  Procedure Laterality Date  . Cesarean section      No Known Allergies    Medication List       This list is accurate as of: 08/31/15  5:08 PM.  Always use your most recent med  list.               acetaminophen 325 MG tablet  Commonly known as:  TYLENOL  Take 650 mg by mouth every 8 (eight) hours.     budesonide-formoterol 160-4.5 MCG/ACT inhaler  Commonly known as:  SYMBICORT  Inhale 2 puffs into the lungs every 12 (twelve) hours.     calcium carbonate 1500 (600 Ca) MG Tabs tablet  Commonly known as:  OSCAL  Take 1,500 mg by mouth 2 (two) times daily with a meal.     cholecalciferol 1000 units tablet  Commonly known as:  VITAMIN D  Take 2,000 Units by mouth daily.     cyanocobalamin 1000 MCG/ML injection  Commonly known as:  (VITAMIN B-12)  Inject 1,000 mcg into the muscle every 28 (twenty-eight) days.     dicyclomine 20 MG tablet  Commonly known as:  BENTYL  Take 20 mg by mouth 3 (three) times daily.     DULoxetine 60 MG capsule  Commonly known as:  CYMBALTA  Take 60 mg by mouth daily.     fluticasone 50 MCG/ACT nasal spray  Commonly known as:  FLONASE  Place 2 sprays into both nostrils at bedtime.     glipiZIDE 5 MG tablet  Commonly known as:  GLUCOTROL  Take 5 mg by mouth 2 (two) times daily.     guaiFENesin 100  MG/5ML liquid  Commonly known as:  ROBITUSSIN  Take 200 mg by mouth every 4 (four) hours as needed for cough.     insulin NPH Human 100 UNIT/ML injection  Commonly known as:  HUMULIN N,NOVOLIN N  Inject 10-22 Units into the skin 2 (two) times daily. Give 22 units every morning and 10 units in the evening.     insulin regular 100 units/mL injection  Commonly known as:  NOVOLIN R,HUMULIN R  Inject 3-8 Units into the skin as needed for high blood sugar. Per sliding scale: If blood sugar 251-325 give 3 units, 326-450 give 6 units, >450 give 8 units     loperamide 2 MG capsule  Commonly known as:  IMODIUM  Take 2-4 mg by mouth as needed for diarrhea or loose stools. Reported on 03/18/2015     LORazepam 0.5 MG tablet  Commonly known as:  ATIVAN  Take 0.5 mg by mouth every 4 (four) hours as needed for anxiety.      metoCLOPramide 5 MG tablet  Commonly known as:  REGLAN  Take 5 mg by mouth 3 (three) times daily.     metoprolol tartrate 25 MG tablet  Commonly known as:  LOPRESSOR  Take 25 mg by mouth 2 (two) times daily.     montelukast 10 MG tablet  Commonly known as:  SINGULAIR  Take 10 mg by mouth at bedtime.     pantoprazole 40 MG tablet  Commonly known as:  PROTONIX  Take 40 mg by mouth 2 (two) times daily.     potassium chloride 20 MEQ packet  Commonly known as:  KLOR-CON  Take 20 mEq by mouth daily.     pravastatin 20 MG tablet  Commonly known as:  PRAVACHOL  Take 20 mg by mouth at bedtime.     PREPARATION H 0.25-14-74.9 % rectal ointment  Generic drug:  phenylephrine-shark liver oil-mineral oil-petrolatum  Place 1 application rectally as needed for hemorrhoids.     promethazine 25 MG tablet  Commonly known as:  PHENERGAN  Take 25 mg by mouth every 4 (four) hours as needed for nausea or vomiting.     rivaroxaban 20 MG Tabs tablet  Commonly known as:  XARELTO  Take 20 mg by mouth daily.     scopolamine 1 MG/3DAYS  Commonly known as:  TRANSDERM-SCOP  Place 1 patch onto the skin every 3 (three) days.     senna-docusate 8.6-50 MG tablet  Commonly known as:  Senokot-S  Take 1 tablet by mouth 2 (two) times daily.     sodium chloride 0.65 % Soln nasal spray  Commonly known as:  OCEAN  Place 2 sprays into both nostrils as needed for congestion.     tiotropium 18 MCG inhalation capsule  Commonly known as:  SPIRIVA  Place 18 mcg into inhaler and inhale daily.     torsemide 10 MG tablet  Commonly known as:  DEMADEX  Take 15 mg by mouth daily.     traMADol 50 MG tablet  Commonly known as:  ULTRAM  Take 50 mg by mouth every 4 (four) hours as needed.     trolamine salicylate 10 % cream  Commonly known as:  ASPERCREME  Apply 1 application topically 4 (four) times daily.     witch hazel-glycerin pad  Commonly known as:  TUCKS  Apply 1-2 application topically as needed for  itching.        Review of Systems  Unable to perform ROS: Dementia  Constitutional: Negative.  HENT: Negative.   Respiratory: Negative for cough, choking, chest tightness and shortness of breath.   Cardiovascular: Negative for chest pain and leg swelling.  Gastrointestinal: Negative.   Genitourinary: Negative.   Musculoskeletal: Negative.  Negative for joint swelling.  Skin: Negative.   Neurological: Negative.   Psychiatric/Behavioral: Positive for hallucinations, confusion and sleep disturbance.  All other systems reviewed and are negative.    There is no immunization history on file for this patient. Pertinent  Health Maintenance Due  Topic Date Due  . DEXA SCAN  01/10/1993  . PNA vac Low Risk Adult (1 of 2 - PCV13) 01/10/1993  . INFLUENZA VACCINE  09/15/2015   No flowsheet data found. Functional Status Survey:    Filed Vitals:   08/31/15 1706  BP: 116/53  Pulse: 69  Temp: 97.1 F (36.2 C)  Resp: 18  SpO2: 98%   There is no weight on file to calculate BMI. Physical Exam  Constitutional: She appears well-developed and well-nourished. No distress.  HENT:  Head: Normocephalic and atraumatic.  Eyes: Conjunctivae and EOM are normal. Pupils are equal, round, and reactive to light. Right eye exhibits no discharge. Left eye exhibits no discharge.  Neck: Normal range of motion. Neck supple. No JVD present. No thyromegaly present.  Cardiovascular: Normal rate, regular rhythm, normal heart sounds and intact distal pulses.  Exam reveals no gallop and no friction rub.   No murmur heard. Pulmonary/Chest: Effort normal and breath sounds normal. No respiratory distress. She has no wheezes. She has no rales. She exhibits no tenderness.  Abdominal: Soft. Bowel sounds are normal. She exhibits no distension. There is no tenderness.  Musculoskeletal: Normal range of motion. She exhibits no edema or tenderness.  Lymphadenopathy:    She has no cervical adenopathy.  Neurological:  She is alert. She is disoriented.  Skin: Skin is warm, dry and intact. She is not diaphoretic. No cyanosis. No pallor. Nails show no clubbing.  Psychiatric: She has a normal mood and affect. Her speech is normal and behavior is normal. Judgment normal. Thought content is delusional. Cognition and memory are impaired. She exhibits abnormal recent memory and abnormal remote memory.  Nursing note and vitals reviewed.   Labs reviewed:  Recent Labs  03/18/15 0820 03/19/15 0525 07/30/15 2330  NA 139 143 140  K 3.9 3.6 3.4*  CL 105 111 101  CO2 20* 22 30  GLUCOSE 226* 157* 57*  BUN 14 13 21*  CREATININE 0.79 0.66 0.83  CALCIUM 8.5* 8.1* 9.5    Recent Labs  02/27/15 0340 03/18/15 0820 03/19/15 0525  AST 10* 11* 10*  ALT 9* 9* 7*  ALKPHOS 55 60 50  BILITOT 0.5 1.5* 1.2  PROT 6.9 6.9 5.8*  ALBUMIN 3.1* 3.0* 2.4*    Recent Labs  02/27/15 0340 03/18/15 0820 03/19/15 0525 07/30/15 2330  WBC 16.0* 21.0* 15.0* 13.7*  NEUTROABS 10.1* 17.3*  --  8.4*  HGB 12.7 12.9 11.5* 12.4  HCT 38.8 39.3 35.3 36.9  MCV 98.4 95.9 98.0 95.8  PLT 243 315 272 282   Lab Results  Component Value Date   TSH 4.600* 11/21/2014   Lab Results  Component Value Date   HGBA1C 7.3* 03/19/2015   Lab Results  Component Value Date   CHOL 136 09/03/2013   HDL 54 09/03/2013   LDLCALC 56 09/03/2013   TRIG 130 09/03/2013   CHOLHDL 2.6 12/19/2006    Significant Diagnostic Results in last 30 days:  No results found.  Assessment/Plan 1. Dementia,  without behavioral disturbance  Monitor for increasing/ worsening symptoms  Monitor for patient safety  Continue Hospice Care  Reassurance and emotional support prn  Family/ staff Communication:   Total Time: 25 minutes  Documentation: 15 minutes  Face to Face: 10 minutes  Family/Phone:   Labs/tests ordered:     Brynda RimShannon H. Hawke Villalpando, NP-C Geriatrics [facility] [address] [place], [place]  [id number] Cell Phone (Mon-Fri 8am-5pm):  [phone] On Call:  [phone] & follow prompts after 5pm & weekends Office Phone:  [phone] Office Fax:  [phone]

## 2015-09-06 DIAGNOSIS — E1129 Type 2 diabetes mellitus with other diabetic kidney complication: Secondary | ICD-10-CM | POA: Diagnosis not present

## 2015-09-06 LAB — GLUCOSE, CAPILLARY: GLUCOSE-CAPILLARY: 113 mg/dL — AB (ref 65–99)

## 2015-09-07 DIAGNOSIS — E1129 Type 2 diabetes mellitus with other diabetic kidney complication: Secondary | ICD-10-CM | POA: Diagnosis not present

## 2015-09-07 LAB — GLUCOSE, CAPILLARY
GLUCOSE-CAPILLARY: 93 mg/dL (ref 65–99)
Glucose-Capillary: 81 mg/dL (ref 65–99)

## 2015-09-08 DIAGNOSIS — E1129 Type 2 diabetes mellitus with other diabetic kidney complication: Secondary | ICD-10-CM | POA: Diagnosis not present

## 2015-09-08 LAB — GLUCOSE, CAPILLARY
GLUCOSE-CAPILLARY: 235 mg/dL — AB (ref 65–99)
Glucose-Capillary: 93 mg/dL (ref 65–99)

## 2015-09-09 DIAGNOSIS — E1129 Type 2 diabetes mellitus with other diabetic kidney complication: Secondary | ICD-10-CM | POA: Diagnosis not present

## 2015-09-09 LAB — GLUCOSE, CAPILLARY
GLUCOSE-CAPILLARY: 152 mg/dL — AB (ref 65–99)
GLUCOSE-CAPILLARY: 170 mg/dL — AB (ref 65–99)

## 2015-09-11 DIAGNOSIS — E1129 Type 2 diabetes mellitus with other diabetic kidney complication: Secondary | ICD-10-CM | POA: Diagnosis not present

## 2015-09-11 LAB — GLUCOSE, CAPILLARY
GLUCOSE-CAPILLARY: 138 mg/dL — AB (ref 65–99)
Glucose-Capillary: 72 mg/dL (ref 65–99)

## 2015-09-12 DIAGNOSIS — E1129 Type 2 diabetes mellitus with other diabetic kidney complication: Secondary | ICD-10-CM | POA: Diagnosis not present

## 2015-09-12 LAB — GLUCOSE, CAPILLARY
GLUCOSE-CAPILLARY: 80 mg/dL (ref 65–99)
Glucose-Capillary: 197 mg/dL — ABNORMAL HIGH (ref 65–99)

## 2015-09-13 DIAGNOSIS — E1129 Type 2 diabetes mellitus with other diabetic kidney complication: Secondary | ICD-10-CM | POA: Diagnosis not present

## 2015-09-13 LAB — GLUCOSE, CAPILLARY
Glucose-Capillary: 133 mg/dL — ABNORMAL HIGH (ref 65–99)
Glucose-Capillary: 254 mg/dL — ABNORMAL HIGH (ref 65–99)
Glucose-Capillary: 108 mg/dL — ABNORMAL HIGH (ref 65–99)

## 2015-09-14 DIAGNOSIS — E1129 Type 2 diabetes mellitus with other diabetic kidney complication: Secondary | ICD-10-CM | POA: Diagnosis not present

## 2015-09-14 LAB — GLUCOSE, CAPILLARY: Glucose-Capillary: 80 mg/dL (ref 65–99)

## 2015-09-15 ENCOUNTER — Encounter
Admission: RE | Admit: 2015-09-15 | Discharge: 2015-09-15 | Disposition: A | Discharge status: Home / Self Care | Source: Ambulatory Visit | Attending: Internal Medicine | Admitting: Internal Medicine

## 2015-09-15 DIAGNOSIS — E1129 Type 2 diabetes mellitus with other diabetic kidney complication: Secondary | ICD-10-CM | POA: Diagnosis present

## 2015-09-15 DIAGNOSIS — N390 Urinary tract infection, site not specified: Secondary | ICD-10-CM | POA: Insufficient documentation

## 2015-09-15 LAB — URINALYSIS COMPLETE WITH MICROSCOPIC (ARMC ONLY)
Bilirubin Urine: NEGATIVE
GLUCOSE, UA: NEGATIVE mg/dL
Ketones, ur: NEGATIVE mg/dL
NITRITE: NEGATIVE
PH: 5 (ref 5.0–8.0)
PROTEIN: NEGATIVE mg/dL
SPECIFIC GRAVITY, URINE: 1.016 (ref 1.005–1.030)
Squamous Epithelial / LPF: NONE SEEN

## 2015-09-16 ENCOUNTER — Non-Acute Institutional Stay (SKILLED_NURSING_FACILITY): Payer: Medicare Other | Admitting: Gerontology

## 2015-09-16 DIAGNOSIS — N390 Urinary tract infection, site not specified: Secondary | ICD-10-CM

## 2015-09-16 LAB — GLUCOSE, CAPILLARY
GLUCOSE-CAPILLARY: 92 mg/dL (ref 65–99)
Glucose-Capillary: 268 mg/dL — ABNORMAL HIGH (ref 65–99)
Glucose-Capillary: 66 mg/dL (ref 65–99)

## 2015-09-17 DIAGNOSIS — N390 Urinary tract infection, site not specified: Secondary | ICD-10-CM | POA: Diagnosis not present

## 2015-09-17 LAB — URINE CULTURE

## 2015-09-17 LAB — GLUCOSE, CAPILLARY
Glucose-Capillary: 150 mg/dL — ABNORMAL HIGH (ref 65–99)
Glucose-Capillary: 193 mg/dL — ABNORMAL HIGH (ref 65–99)

## 2015-09-17 NOTE — Progress Notes (Signed)
Location:      Place of Service:  SNF (31) Provider:  Lorenso Quarry, NP-C  Lauro Regulus., MD  Patient Care Team: Lauro Regulus, MD as PCP - General (Internal Medicine)  Extended Emergency Contact Information Primary Emergency Contact: Rudd,Robin Address: [address]          [place], [place] [address] [place] Home Phone: [phone] Relation: Daughter Secondary Emergency Contact: Cecilie Kicks Address: [address] [place], [place] [address] [place] of [place] Home Phone: [phone] Relation: Daughter  Code Status:  DNR Goals of care: Advanced Directive information Advanced Directives 03/18/2015  Does patient have an advance directive? Yes  Type of Advance Directive Out of facility DNR (pink MOST or yellow form)     Chief Complaint  Patient presents with  . Acute Visit    HPI:  Pt is a 83 y.o. female seen today for Acute visit for Urinary Tract Infection, Unspecified. Pt had been having increased confusion, hallucinations, strong smelling urine. She had a low grade fever of 98.4 and general malaise. No true fever, chills, nausea, vomiting. Vitals have remained stable. No other signs of sepsis  Past Medical History:  Diagnosis Date  . Collagen vascular disease (HCC)   . COPD (chronic obstructive pulmonary disease) (HCC)   . Coronary artery disease   . Diabetes mellitus without complication (HCC)   . Hyperlipidemia   . Hypertension   . Renal disorder    Past Surgical History:  Procedure Laterality Date  . CESAREAN SECTION      No Known Allergies    Medication List       Accurate as of 09/16/15 11:59 PM. Always use your most recent med list.          acetaminophen 325 MG tablet Commonly known as:  TYLENOL Take 650 mg by mouth every 8 (eight) hours.   budesonide-formoterol 160-4.5 MCG/ACT inhaler Commonly known as:  SYMBICORT Inhale 2 puffs into the lungs every 12 (twelve) hours.   calcium  carbonate 1500 (600 Ca) MG Tabs tablet Commonly known as:  OSCAL Take 1,500 mg by mouth 2 (two) times daily with a meal.   cholecalciferol 1000 units tablet Commonly known as:  VITAMIN D Take 2,000 Units by mouth daily.   cyanocobalamin 1000 MCG/ML injection Commonly known as:  (VITAMIN B-12) Inject 1,000 mcg into the muscle every 28 (twenty-eight) days.   dicyclomine 20 MG tablet Commonly known as:  BENTYL Take 20 mg by mouth 3 (three) times daily.   DULoxetine 60 MG capsule Commonly known as:  CYMBALTA Take 60 mg by mouth daily.   fluticasone 50 MCG/ACT nasal spray Commonly known as:  FLONASE Place 2 sprays into both nostrils at bedtime.   glipiZIDE 5 MG tablet Commonly known as:  GLUCOTROL Take 5 mg by mouth 2 (two) times daily.   guaiFENesin 100 MG/5ML liquid Commonly known as:  ROBITUSSIN Take 200 mg by mouth every 4 (four) hours as needed for cough.   insulin NPH Human 100 UNIT/ML injection Commonly known as:  HUMULIN N,NOVOLIN N Inject 10-22 Units into the skin 2 (two) times daily. Give 22 units every morning and 10 units in the evening.   insulin regular 100 units/mL injection Commonly known as:  NOVOLIN R,HUMULIN R Inject 3-8 Units into the skin as needed for high blood sugar. Per sliding scale: If blood sugar 251-325 give 3 units, 326-450 give 6 units, >450 give 8 units  loperamide 2 MG capsule Commonly known as:  IMODIUM Take 2-4 mg by mouth as needed for diarrhea or loose stools. Reported on 03/18/2015   LORazepam 0.5 MG tablet Commonly known as:  ATIVAN Take 0.5 mg by mouth every 4 (four) hours as needed for anxiety.   metoCLOPramide 5 MG tablet Commonly known as:  REGLAN Take 5 mg by mouth 3 (three) times daily.   metoprolol tartrate 25 MG tablet Commonly known as:  LOPRESSOR Take 25 mg by mouth 2 (two) times daily.   montelukast 10 MG tablet Commonly known as:  SINGULAIR Take 10 mg by mouth at bedtime.   pantoprazole 40 MG tablet Commonly  known as:  PROTONIX Take 40 mg by mouth 2 (two) times daily.   potassium chloride 20 MEQ packet Commonly known as:  KLOR-CON Take 20 mEq by mouth daily.   pravastatin 20 MG tablet Commonly known as:  PRAVACHOL Take 20 mg by mouth at bedtime.   PREPARATION H 0.25-14-74.9 % rectal ointment Generic drug:  phenylephrine-shark liver oil-mineral oil-petrolatum Place 1 application rectally as needed for hemorrhoids.   promethazine 25 MG tablet Commonly known as:  PHENERGAN Take 25 mg by mouth every 4 (four) hours as needed for nausea or vomiting.   rivaroxaban 20 MG Tabs tablet Commonly known as:  XARELTO Take 20 mg by mouth daily.   scopolamine 1 MG/3DAYS Commonly known as:  TRANSDERM-SCOP Place 1 patch onto the skin every 3 (three) days.   senna-docusate 8.6-50 MG tablet Commonly known as:  Senokot-S Take 1 tablet by mouth 2 (two) times daily.   sodium chloride 0.65 % Soln nasal spray Commonly known as:  OCEAN Place 2 sprays into both nostrils as needed for congestion.   tiotropium 18 MCG inhalation capsule Commonly known as:  SPIRIVA Place 18 mcg into inhaler and inhale daily.   torsemide 10 MG tablet Commonly known as:  DEMADEX Take 15 mg by mouth daily.   traMADol 50 MG tablet Commonly known as:  ULTRAM Take 50 mg by mouth every 4 (four) hours as needed.   trolamine salicylate 10 % cream Commonly known as:  ASPERCREME Apply 1 application topically 4 (four) times daily.   witch hazel-glycerin pad Commonly known as:  TUCKS Apply 1-2 application topically as needed for itching.       Review of Systems  Unable to perform ROS: Dementia  Constitutional: Negative.   HENT: Negative.   Respiratory: Negative for cough, choking, chest tightness and shortness of breath.   Cardiovascular: Negative for chest pain and leg swelling.  Gastrointestinal: Negative.   Genitourinary: Positive for frequency and urgency.  Musculoskeletal: Negative.  Negative for joint  swelling.  Skin: Negative.   Neurological: Negative.   Psychiatric/Behavioral: Positive for confusion, hallucinations and sleep disturbance.  All other systems reviewed and are negative.    There is no immunization history on file for this patient. Pertinent  Health Maintenance Due  Topic Date Due  . DEXA SCAN  01/10/1993  . PNA vac Low Risk Adult (1 of 2 - PCV13) 01/10/1993  . INFLUENZA VACCINE  09/15/2015   No flowsheet data found. Functional Status Survey:    Vitals:   09/16/15 2209  BP: (!) 133/58  Pulse: 82  Resp: (!) 22  Temp: (!) 95.9 F (35.5 C)  SpO2: 100%  Weight: 182 lb (82.6 kg)   Body mass index is 34.39 kg/m. Physical Exam  Constitutional: She appears well-developed and well-nourished. No distress.  HENT:  Head: Normocephalic and atraumatic.  Pt had 6 teeth extracted today by dentist. Gums are red and sore with mild bleeding.   Eyes: Conjunctivae and EOM are normal. Pupils are equal, round, and reactive to light. Right eye exhibits no discharge. Left eye exhibits no discharge.  Neck: Normal range of motion. Neck supple. No JVD present. No thyromegaly present.  Cardiovascular: Normal rate, regular rhythm, normal heart sounds and intact distal pulses.  Exam reveals no gallop and no friction rub.   No murmur heard. Pulmonary/Chest: Effort normal and breath sounds normal. No respiratory distress. She has no wheezes. She has no rales. She exhibits no tenderness.  Abdominal: Soft. Normal appearance and bowel sounds are normal. She exhibits no distension. There is tenderness in the suprapubic area.  Musculoskeletal: Normal range of motion. She exhibits no edema or tenderness.  Lymphadenopathy:    She has no cervical adenopathy.  Neurological: She is alert. She is disoriented.  Skin: Skin is warm, dry and intact. She is not diaphoretic. No cyanosis. No pallor. Nails show no clubbing.  Psychiatric: She has a normal mood and affect. Her speech is normal and behavior  is normal. Judgment normal. Thought content is delusional. Cognition and memory are impaired. She exhibits abnormal recent memory and abnormal remote memory.  Nursing note and vitals reviewed.   Labs reviewed:  Recent Labs  03/18/15 0820 03/19/15 0525 07/30/15 2330  NA 139 143 140  K 3.9 3.6 3.4*  CL 105 111 101  CO2 20* 22 30  GLUCOSE 226* 157* 57*  BUN 14 13 21*  CREATININE 0.79 0.66 0.83  CALCIUM 8.5* 8.1* 9.5    Recent Labs  02/27/15 0340 03/18/15 0820 03/19/15 0525  AST 10* 11* 10*  ALT 9* 9* 7*  ALKPHOS 55 60 50  BILITOT 0.5 1.5* 1.2  PROT 6.9 6.9 5.8*  ALBUMIN 3.1* 3.0* 2.4*    Recent Labs  02/27/15 0340 03/18/15 0820 03/19/15 0525 07/30/15 2330  WBC 16.0* 21.0* 15.0* 13.7*  NEUTROABS 10.1* 17.3*  --  8.4*  HGB 12.7 12.9 11.5* 12.4  HCT 38.8 39.3 35.3 36.9  MCV 98.4 95.9 98.0 95.8  PLT 243 315 272 282   Lab Results  Component Value Date   TSH 4.600 (H) 11/21/2014   Lab Results  Component Value Date   HGBA1C 7.3 (H) 03/19/2015   Lab Results  Component Value Date   CHOL 136 09/03/2013   HDL 54 09/03/2013   LDLCALC 56 09/03/2013   TRIG 130 09/03/2013   CHOLHDL 2.6 12/19/2006    Significant Diagnostic Results in last 30 days:  No results found.  Assessment/Plan  1. Urinary Tract Infection  Monitor for signs of sepsis  Encourage fluid intake  Bactrim DS 1 tablet po Q 12 hours x 10 doses for Klebsiella uti  Pain control (for bladder and teeth extraction that occurred today) with prn medications already ordered   Family/ staff Communication:   Total Time: 25 minutes  Documentation: 15 minutes  Face to Face: 10 minutes  Family/Phone:   Labs/tests ordered: ua, c&s     Brynda Rim, NP-C Geriatrics [facility] [address], [place] [address] Cell Phone (Mon-Fri 8am-5pm):  [phone] On Call:  [phone] & follow prompts after 5pm & weekends Office Phone:   [phone] Office Fax:  [phone]

## 2015-09-18 DIAGNOSIS — N390 Urinary tract infection, site not specified: Secondary | ICD-10-CM | POA: Diagnosis not present

## 2015-09-18 LAB — GLUCOSE, CAPILLARY
GLUCOSE-CAPILLARY: 76 mg/dL (ref 65–99)
GLUCOSE-CAPILLARY: 221 mg/dL — AB (ref 65–99)

## 2015-09-19 DIAGNOSIS — N390 Urinary tract infection, site not specified: Secondary | ICD-10-CM | POA: Diagnosis not present

## 2015-09-19 LAB — GLUCOSE, CAPILLARY
GLUCOSE-CAPILLARY: 184 mg/dL — AB (ref 65–99)
Glucose-Capillary: 122 mg/dL — ABNORMAL HIGH (ref 65–99)

## 2015-09-20 DIAGNOSIS — N390 Urinary tract infection, site not specified: Secondary | ICD-10-CM | POA: Diagnosis not present

## 2015-09-20 LAB — GLUCOSE, CAPILLARY
GLUCOSE-CAPILLARY: 190 mg/dL — AB (ref 65–99)
GLUCOSE-CAPILLARY: 92 mg/dL (ref 65–99)

## 2015-09-21 DIAGNOSIS — N390 Urinary tract infection, site not specified: Secondary | ICD-10-CM | POA: Diagnosis not present

## 2015-09-21 LAB — GLUCOSE, CAPILLARY
GLUCOSE-CAPILLARY: 193 mg/dL — AB (ref 65–99)
Glucose-Capillary: 116 mg/dL — ABNORMAL HIGH (ref 65–99)

## 2015-09-22 DIAGNOSIS — N390 Urinary tract infection, site not specified: Secondary | ICD-10-CM | POA: Diagnosis not present

## 2015-09-22 LAB — GLUCOSE, CAPILLARY
GLUCOSE-CAPILLARY: 148 mg/dL — AB (ref 65–99)
Glucose-Capillary: 212 mg/dL — ABNORMAL HIGH (ref 65–99)

## 2015-09-23 DIAGNOSIS — N390 Urinary tract infection, site not specified: Secondary | ICD-10-CM | POA: Diagnosis not present

## 2015-09-23 LAB — GLUCOSE, CAPILLARY
GLUCOSE-CAPILLARY: 96 mg/dL (ref 65–99)
GLUCOSE-CAPILLARY: 70 mg/dL (ref 65–99)
Glucose-Capillary: 45 mg/dL — ABNORMAL LOW (ref 65–99)

## 2015-09-24 DIAGNOSIS — N390 Urinary tract infection, site not specified: Secondary | ICD-10-CM | POA: Diagnosis not present

## 2015-09-24 LAB — GLUCOSE, CAPILLARY
Glucose-Capillary: 123 mg/dL — ABNORMAL HIGH (ref 65–99)
Glucose-Capillary: 181 mg/dL — ABNORMAL HIGH (ref 65–99)

## 2015-09-25 DIAGNOSIS — N390 Urinary tract infection, site not specified: Secondary | ICD-10-CM | POA: Diagnosis not present

## 2015-09-25 LAB — GLUCOSE, CAPILLARY
GLUCOSE-CAPILLARY: 136 mg/dL — AB (ref 65–99)
GLUCOSE-CAPILLARY: 233 mg/dL — AB (ref 65–99)
GLUCOSE-CAPILLARY: 162 mg/dL — AB (ref 65–99)

## 2015-09-26 DIAGNOSIS — N390 Urinary tract infection, site not specified: Secondary | ICD-10-CM | POA: Diagnosis not present

## 2015-09-26 LAB — GLUCOSE, CAPILLARY
GLUCOSE-CAPILLARY: 147 mg/dL — AB (ref 65–99)
Glucose-Capillary: 231 mg/dL — ABNORMAL HIGH (ref 65–99)
Glucose-Capillary: 131 mg/dL — ABNORMAL HIGH (ref 65–99)

## 2015-09-27 DIAGNOSIS — N390 Urinary tract infection, site not specified: Secondary | ICD-10-CM | POA: Diagnosis not present

## 2015-09-27 LAB — GLUCOSE, CAPILLARY
Glucose-Capillary: 163 mg/dL — ABNORMAL HIGH (ref 65–99)
Glucose-Capillary: 207 mg/dL — ABNORMAL HIGH (ref 65–99)

## 2015-09-28 DIAGNOSIS — N390 Urinary tract infection, site not specified: Secondary | ICD-10-CM | POA: Diagnosis not present

## 2015-09-28 LAB — GLUCOSE, CAPILLARY
GLUCOSE-CAPILLARY: 170 mg/dL — AB (ref 65–99)
Glucose-Capillary: 114 mg/dL — ABNORMAL HIGH (ref 65–99)

## 2015-09-29 DIAGNOSIS — N390 Urinary tract infection, site not specified: Secondary | ICD-10-CM | POA: Diagnosis not present

## 2015-09-29 LAB — GLUCOSE, CAPILLARY: GLUCOSE-CAPILLARY: 213 mg/dL — AB (ref 65–99)

## 2015-09-30 DIAGNOSIS — N390 Urinary tract infection, site not specified: Secondary | ICD-10-CM | POA: Diagnosis not present

## 2015-09-30 LAB — GLUCOSE, CAPILLARY
GLUCOSE-CAPILLARY: 149 mg/dL — AB (ref 65–99)
Glucose-Capillary: 103 mg/dL — ABNORMAL HIGH (ref 65–99)

## 2015-10-01 DIAGNOSIS — N390 Urinary tract infection, site not specified: Secondary | ICD-10-CM | POA: Diagnosis not present

## 2015-10-01 LAB — GLUCOSE, CAPILLARY
GLUCOSE-CAPILLARY: 140 mg/dL — AB (ref 65–99)
GLUCOSE-CAPILLARY: 206 mg/dL — AB (ref 65–99)

## 2015-10-02 DIAGNOSIS — N390 Urinary tract infection, site not specified: Secondary | ICD-10-CM | POA: Diagnosis not present

## 2015-10-02 LAB — GLUCOSE, CAPILLARY
GLUCOSE-CAPILLARY: 188 mg/dL — AB (ref 65–99)
GLUCOSE-CAPILLARY: 217 mg/dL — AB (ref 65–99)

## 2015-10-03 DIAGNOSIS — N390 Urinary tract infection, site not specified: Secondary | ICD-10-CM | POA: Diagnosis not present

## 2015-10-03 LAB — GLUCOSE, CAPILLARY
GLUCOSE-CAPILLARY: 177 mg/dL — AB (ref 65–99)
GLUCOSE-CAPILLARY: 47 mg/dL — AB (ref 65–99)

## 2015-10-04 DIAGNOSIS — N390 Urinary tract infection, site not specified: Secondary | ICD-10-CM | POA: Diagnosis not present

## 2015-10-04 LAB — GLUCOSE, CAPILLARY
GLUCOSE-CAPILLARY: 151 mg/dL — AB (ref 65–99)
Glucose-Capillary: 195 mg/dL — ABNORMAL HIGH (ref 65–99)

## 2015-10-05 DIAGNOSIS — N390 Urinary tract infection, site not specified: Secondary | ICD-10-CM | POA: Diagnosis not present

## 2015-10-05 LAB — GLUCOSE, CAPILLARY
GLUCOSE-CAPILLARY: 85 mg/dL (ref 65–99)
GLUCOSE-CAPILLARY: 81 mg/dL (ref 65–99)

## 2015-10-06 DIAGNOSIS — N390 Urinary tract infection, site not specified: Secondary | ICD-10-CM | POA: Diagnosis not present

## 2015-10-06 LAB — GLUCOSE, CAPILLARY
GLUCOSE-CAPILLARY: 138 mg/dL — AB (ref 65–99)
GLUCOSE-CAPILLARY: 193 mg/dL — AB (ref 65–99)

## 2015-10-07 DIAGNOSIS — E1129 Type 2 diabetes mellitus with other diabetic kidney complication: Secondary | ICD-10-CM | POA: Diagnosis present

## 2015-10-07 DIAGNOSIS — N390 Urinary tract infection, site not specified: Secondary | ICD-10-CM | POA: Diagnosis not present

## 2015-10-07 LAB — GLUCOSE, CAPILLARY
GLUCOSE-CAPILLARY: 127 mg/dL — AB (ref 65–99)
GLUCOSE-CAPILLARY: 30 mg/dL — AB (ref 65–99)
Glucose-Capillary: 137 mg/dL — ABNORMAL HIGH (ref 65–99)
Glucose-Capillary: 59 mg/dL — ABNORMAL LOW (ref 65–99)

## 2015-10-08 DIAGNOSIS — N390 Urinary tract infection, site not specified: Secondary | ICD-10-CM | POA: Diagnosis not present

## 2015-10-08 LAB — GLUCOSE, CAPILLARY
Glucose-Capillary: 147 mg/dL — ABNORMAL HIGH (ref 65–99)
Glucose-Capillary: 76 mg/dL (ref 65–99)

## 2015-10-09 DIAGNOSIS — N390 Urinary tract infection, site not specified: Secondary | ICD-10-CM | POA: Diagnosis not present

## 2015-10-09 LAB — GLUCOSE, CAPILLARY: GLUCOSE-CAPILLARY: 101 mg/dL — AB (ref 65–99)

## 2015-10-10 DIAGNOSIS — N390 Urinary tract infection, site not specified: Secondary | ICD-10-CM | POA: Diagnosis not present

## 2015-10-10 LAB — GLUCOSE, CAPILLARY
GLUCOSE-CAPILLARY: 68 mg/dL (ref 65–99)
GLUCOSE-CAPILLARY: 85 mg/dL (ref 65–99)

## 2015-10-11 DIAGNOSIS — N390 Urinary tract infection, site not specified: Secondary | ICD-10-CM | POA: Diagnosis not present

## 2015-10-11 LAB — GLUCOSE, CAPILLARY
Glucose-Capillary: 152 mg/dL — ABNORMAL HIGH (ref 65–99)
Glucose-Capillary: 263 mg/dL — ABNORMAL HIGH (ref 65–99)

## 2015-10-13 DIAGNOSIS — N390 Urinary tract infection, site not specified: Secondary | ICD-10-CM | POA: Diagnosis not present

## 2015-10-13 LAB — GLUCOSE, CAPILLARY: GLUCOSE-CAPILLARY: 234 mg/dL — AB (ref 65–99)

## 2015-10-14 DIAGNOSIS — N390 Urinary tract infection, site not specified: Secondary | ICD-10-CM | POA: Diagnosis not present

## 2015-10-14 LAB — GLUCOSE, CAPILLARY: Glucose-Capillary: 186 mg/dL — ABNORMAL HIGH (ref 65–99)

## 2015-10-15 DIAGNOSIS — N390 Urinary tract infection, site not specified: Secondary | ICD-10-CM | POA: Diagnosis not present

## 2015-10-15 LAB — GLUCOSE, CAPILLARY: GLUCOSE-CAPILLARY: 204 mg/dL — AB (ref 65–99)

## 2015-10-16 ENCOUNTER — Encounter
Admission: RE | Admit: 2015-10-16 | Source: Ambulatory Visit | Attending: Internal Medicine | Admitting: Internal Medicine

## 2015-10-16 ENCOUNTER — Encounter
Admission: RE | Admit: 2015-10-16 | Discharge: 2015-10-16 | Disposition: A | Discharge status: Home / Self Care | Source: Ambulatory Visit | Attending: Internal Medicine | Admitting: Internal Medicine

## 2015-10-16 DIAGNOSIS — E1129 Type 2 diabetes mellitus with other diabetic kidney complication: Secondary | ICD-10-CM | POA: Diagnosis present

## 2015-10-16 LAB — GLUCOSE, CAPILLARY: Glucose-Capillary: 266 mg/dL — ABNORMAL HIGH (ref 65–99)

## 2015-10-17 DIAGNOSIS — E1129 Type 2 diabetes mellitus with other diabetic kidney complication: Secondary | ICD-10-CM | POA: Diagnosis not present

## 2015-10-17 LAB — GLUCOSE, CAPILLARY
Glucose-Capillary: 252 mg/dL — ABNORMAL HIGH (ref 65–99)
Glucose-Capillary: 181 mg/dL — ABNORMAL HIGH (ref 65–99)

## 2015-10-18 DIAGNOSIS — E1129 Type 2 diabetes mellitus with other diabetic kidney complication: Secondary | ICD-10-CM | POA: Diagnosis not present

## 2015-10-18 LAB — GLUCOSE, CAPILLARY
GLUCOSE-CAPILLARY: 45 mg/dL — AB (ref 65–99)
GLUCOSE-CAPILLARY: 165 mg/dL — AB (ref 65–99)
GLUCOSE-CAPILLARY: 77 mg/dL (ref 65–99)
GLUCOSE-CAPILLARY: 85 mg/dL (ref 65–99)
Glucose-Capillary: 53 mg/dL — ABNORMAL LOW (ref 65–99)
Glucose-Capillary: 57 mg/dL — ABNORMAL LOW (ref 65–99)
Glucose-Capillary: 63 mg/dL — ABNORMAL LOW (ref 65–99)

## 2015-10-19 DIAGNOSIS — E1129 Type 2 diabetes mellitus with other diabetic kidney complication: Secondary | ICD-10-CM | POA: Diagnosis not present

## 2015-10-19 LAB — GLUCOSE, CAPILLARY
Glucose-Capillary: 154 mg/dL — ABNORMAL HIGH (ref 65–99)
Glucose-Capillary: 211 mg/dL — ABNORMAL HIGH (ref 65–99)

## 2015-10-20 DIAGNOSIS — E1129 Type 2 diabetes mellitus with other diabetic kidney complication: Secondary | ICD-10-CM | POA: Diagnosis not present

## 2015-10-20 LAB — GLUCOSE, CAPILLARY
GLUCOSE-CAPILLARY: 125 mg/dL — AB (ref 65–99)
GLUCOSE-CAPILLARY: 46 mg/dL — AB (ref 65–99)
GLUCOSE-CAPILLARY: 102 mg/dL — AB (ref 65–99)

## 2015-10-21 ENCOUNTER — Non-Acute Institutional Stay (SKILLED_NURSING_FACILITY): Payer: Medicare Other | Admitting: Gerontology

## 2015-10-21 DIAGNOSIS — F0390 Unspecified dementia without behavioral disturbance: Secondary | ICD-10-CM

## 2015-10-21 DIAGNOSIS — E1169 Type 2 diabetes mellitus with other specified complication: Secondary | ICD-10-CM | POA: Diagnosis not present

## 2015-10-21 DIAGNOSIS — E1129 Type 2 diabetes mellitus with other diabetic kidney complication: Secondary | ICD-10-CM | POA: Diagnosis not present

## 2015-10-21 LAB — GLUCOSE, CAPILLARY
Glucose-Capillary: 107 mg/dL — ABNORMAL HIGH (ref 65–99)
Glucose-Capillary: 243 mg/dL — ABNORMAL HIGH (ref 65–99)

## 2015-10-21 NOTE — Progress Notes (Signed)
Location:      Place of Service:  SNF (31) Provider:  Lorenso QuarryShannon Dick Hark, NP-C  Lauro RegulusANDERSON,MARSHALL W., MD  Patient Care Team: Lauro RegulusMarshall W Anderson, MD as PCP - General (Internal Medicine)  Extended Emergency Contact Information Primary Emergency Contact: Rudd,Robin Address: [address]          [place], [place] [address] [place] Home Phone: [phone] Relation: Daughter Secondary Emergency Contact: Cecilie KicksKernodle,Jean Address: [address] [place]          [place], [place] [address] [place] Home Phone: [phone] Relation: Daughter  Code Status:  DNR Goals of care: Advanced Directive information Advanced Directives 03/18/2015  Does patient have an advance directive? Yes  Type of Advance Directive Out of facility DNR (pink MOST or yellow form)     Chief Complaint  Patient presents with  . Medical Management of Chronic Issues    HPI:  Pt is a 82 y.o. female seen today for medical management of chronic diseases. Nigel Bertholdllen Lister was Seen for management of chronic diseases. Patient has history of dementia and diabetes mellitus. Diabetes is well controlled on current regimen of Humulin n twice a day. Patient is having decreased po intake. Staff monitoring blood sugars closely to avoid hypoglycemic episodes. Patients dementia has progressed more rapidly as of late. Family came to visit this past weekend. Patient did not recognize daughter or grandchildren. Patients become more withdrawn. Sleeping more. Patient denies pain or discomfort. Vital signs stable. No other complaints.     Past Medical History:  Diagnosis Date  . Collagen vascular disease (HCC)   . COPD (chronic obstructive pulmonary disease) (HCC)   . Coronary artery disease   . Diabetes mellitus without complication (HCC)   . Hyperlipidemia   . Hypertension   . Renal disorder    Past Surgical History:  Procedure Laterality Date  . CESAREAN SECTION      No Known Allergies    Medication  List       Accurate as of 10/21/15  6:00 PM. Always use your most recent med list.          acetaminophen 325 MG tablet Commonly known as:  TYLENOL Take 650 mg by mouth every 8 (eight) hours.   budesonide-formoterol 160-4.5 MCG/ACT inhaler Commonly known as:  SYMBICORT Inhale 2 puffs into the lungs every 12 (twelve) hours.   calcium carbonate 1500 (600 Ca) MG Tabs tablet Commonly known as:  OSCAL Take 1,500 mg by mouth 2 (two) times daily with a meal.   cholecalciferol 1000 units tablet Commonly known as:  VITAMIN D Take 2,000 Units by mouth daily.   cyanocobalamin 1000 MCG/ML injection Commonly known as:  (VITAMIN B-12) Inject 1,000 mcg into the muscle every 28 (twenty-eight) days.   dicyclomine 20 MG tablet Commonly known as:  BENTYL Take 20 mg by mouth 3 (three) times daily.   DULoxetine 60 MG capsule Commonly known as:  CYMBALTA Take 60 mg by mouth daily.   fluticasone 50 MCG/ACT nasal spray Commonly known as:  FLONASE Place 2 sprays into both nostrils at bedtime.   glipiZIDE 5 MG tablet Commonly known as:  GLUCOTROL Take 5 mg by mouth 2 (two) times daily.   guaiFENesin 100 MG/5ML liquid Commonly known as:  ROBITUSSIN Take 200 mg by mouth every 4 (four) hours as needed for cough.   insulin NPH Human 100 UNIT/ML injection Commonly known as:  HUMULIN N,NOVOLIN N Inject 10-22 Units into the skin 2 (two) times daily. Give 22  units every morning and 10 units in the evening.   insulin regular 100 units/mL injection Commonly known as:  NOVOLIN R,HUMULIN R Inject 3-8 Units into the skin as needed for high blood sugar. Per sliding scale: If blood sugar 251-325 give 3 units, 326-450 give 6 units, >450 give 8 units   loperamide 2 MG capsule Commonly known as:  IMODIUM Take 2-4 mg by mouth as needed for diarrhea or loose stools. Reported on 03/18/2015   LORazepam 0.5 MG tablet Commonly known as:  ATIVAN Take 0.5 mg by mouth every 4 (four) hours as needed for  anxiety.   metoCLOPramide 5 MG tablet Commonly known as:  REGLAN Take 5 mg by mouth 3 (three) times daily.   metoprolol tartrate 25 MG tablet Commonly known as:  LOPRESSOR Take 25 mg by mouth 2 (two) times daily.   montelukast 10 MG tablet Commonly known as:  SINGULAIR Take 10 mg by mouth at bedtime.   pantoprazole 40 MG tablet Commonly known as:  PROTONIX Take 40 mg by mouth 2 (two) times daily.   potassium chloride 20 MEQ packet Commonly known as:  KLOR-CON Take 20 mEq by mouth daily.   pravastatin 20 MG tablet Commonly known as:  PRAVACHOL Take 20 mg by mouth at bedtime.   PREPARATION H 0.25-14-74.9 % rectal ointment Generic drug:  phenylephrine-shark liver oil-mineral oil-petrolatum Place 1 application rectally as needed for hemorrhoids.   promethazine 25 MG tablet Commonly known as:  PHENERGAN Take 25 mg by mouth every 4 (four) hours as needed for nausea or vomiting.   rivaroxaban 20 MG Tabs tablet Commonly known as:  XARELTO Take 20 mg by mouth daily.   scopolamine 1 MG/3DAYS Commonly known as:  TRANSDERM-SCOP Place 1 patch onto the skin every 3 (three) days.   senna-docusate 8.6-50 MG tablet Commonly known as:  Senokot-S Take 1 tablet by mouth 2 (two) times daily.   sodium chloride 0.65 % Soln nasal spray Commonly known as:  OCEAN Place 2 sprays into both nostrils as needed for congestion.   tiotropium 18 MCG inhalation capsule Commonly known as:  SPIRIVA Place 18 mcg into inhaler and inhale daily.   torsemide 10 MG tablet Commonly known as:  DEMADEX Take 15 mg by mouth daily.   traMADol 50 MG tablet Commonly known as:  ULTRAM Take 50 mg by mouth every 4 (four) hours as needed.   trolamine salicylate 10 % cream Commonly known as:  ASPERCREME Apply 1 application topically 4 (four) times daily.   witch hazel-glycerin pad Commonly known as:  TUCKS Apply 1-2 application topically as needed for itching.       Review of Systems  Unable to  perform ROS: Dementia  Constitutional: Negative.   HENT: Negative.   Respiratory: Negative for cough, choking, chest tightness and shortness of breath.   Cardiovascular: Negative for chest pain and leg swelling.  Gastrointestinal: Negative.   Genitourinary: Negative.   Musculoskeletal: Negative.  Negative for joint swelling.  Skin: Negative.   Neurological: Negative.   Psychiatric/Behavioral: Positive for confusion, hallucinations and sleep disturbance.  All other systems reviewed and are negative.    There is no immunization history on file for this patient. Pertinent  Health Maintenance Due  Topic Date Due  . FOOT EXAM  01/10/1936  . OPHTHALMOLOGY EXAM  01/10/1936  . DEXA SCAN  01/10/1993  . PNA vac Low Risk Adult (1 of 2 - PCV13) 01/10/1993  . INFLUENZA VACCINE  09/15/2015  . HEMOGLOBIN A1C  09/16/2015  No flowsheet data found. Functional Status Survey:    Vitals:   10/20/15 0500  BP: 125/68  Pulse: 96  Resp: (!) 22  Temp: 97.3 F (36.3 C)  SpO2: 90%   There is no height or weight on file to calculate BMI. Physical Exam  Constitutional: She appears well-developed and well-nourished. No distress.  HENT:  Head: Normocephalic and atraumatic.  Eyes: Conjunctivae and EOM are normal. Pupils are equal, round, and reactive to light. Right eye exhibits no discharge. Left eye exhibits no discharge.  Neck: Normal range of motion. Neck supple. No JVD present. No thyromegaly present.  Cardiovascular: Normal rate, regular rhythm, normal heart sounds and intact distal pulses.  Exam reveals no gallop and no friction rub.   No murmur heard. Pulses:      Dorsalis pedis pulses are 2+ on the right side, and 2+ on the left side.  Non-pitting pedal edema  Pulmonary/Chest: Effort normal and breath sounds normal. No respiratory distress. She has no wheezes. She has no rales. She exhibits no tenderness.  Abdominal: Soft. Bowel sounds are normal. She exhibits no distension. There is no  tenderness.  Musculoskeletal: Normal range of motion. She exhibits no edema or tenderness.  Lymphadenopathy:    She has no cervical adenopathy.  Neurological: She is alert. She is disoriented.  Skin: Skin is warm, dry and intact. No rash noted. She is not diaphoretic. No cyanosis or erythema. No pallor. Nails show no clubbing.  Psychiatric: She has a normal mood and affect. Her speech is normal and behavior is normal. Judgment normal. Thought content is delusional. Cognition and memory are impaired. She exhibits abnormal recent memory and abnormal remote memory.  Nursing note and vitals reviewed.   Labs reviewed:  Recent Labs  03/18/15 0820 03/19/15 0525 07/30/15 2330  NA 139 143 140  K 3.9 3.6 3.4*  CL 105 111 101  CO2 20* 22 30  GLUCOSE 226* 157* 57*  BUN 14 13 21*  CREATININE 0.79 0.66 0.83  CALCIUM 8.5* 8.1* 9.5    Recent Labs  02/27/15 0340 03/18/15 0820 03/19/15 0525  AST 10* 11* 10*  ALT 9* 9* 7*  ALKPHOS 55 60 50  BILITOT 0.5 1.5* 1.2  PROT 6.9 6.9 5.8*  ALBUMIN 3.1* 3.0* 2.4*    Recent Labs  02/27/15 0340 03/18/15 0820 03/19/15 0525 07/30/15 2330  WBC 16.0* 21.0* 15.0* 13.7*  NEUTROABS 10.1* 17.3*  --  8.4*  HGB 12.7 12.9 11.5* 12.4  HCT 38.8 39.3 35.3 36.9  MCV 98.4 95.9 98.0 95.8  PLT 243 315 272 282   Lab Results  Component Value Date   TSH 4.600 (H) 11/21/2014   Lab Results  Component Value Date   HGBA1C 7.3 (H) 03/19/2015   Lab Results  Component Value Date   CHOL 136 09/03/2013   HDL 54 09/03/2013   LDLCALC 56 09/03/2013   TRIG 130 09/03/2013   CHOLHDL 2.6 12/19/2006    Significant Diagnostic Results in last 30 days:  No results found.  Assessment/Plan 1. Senile dementia, uncomplicated  Under Hospice care  Continue Hospice services  Reassurance for pt when needed.   Maintain pt safety  2. Diabetes mellitus with other specified manifestations  FSBS well controlled on current regimen  Continue current  regimen  Monitor closely for hypoglycemia with decreased po intake  Family/ staff Communication:   Total Time:  Documentation:  Face to Face:  Family/Phone:   Labs/tests ordered:  none  Medication list reviewed and assessed for continued  appropriateness. Monthly medication orders reviewed and signed.  Vikki Ports, NP-C Geriatrics [facility] [phone], [place] [address] Cell Phone (Mon-Fri 8am-5pm):  [phone] On Call:  [phone] & follow prompts after 5pm & weekends Office Phone:  [phone] Office Fax:  [phone]

## 2015-10-22 DIAGNOSIS — E1129 Type 2 diabetes mellitus with other diabetic kidney complication: Secondary | ICD-10-CM | POA: Diagnosis not present

## 2015-10-22 LAB — GLUCOSE, CAPILLARY
GLUCOSE-CAPILLARY: 76 mg/dL (ref 65–99)
Glucose-Capillary: 76 mg/dL (ref 65–99)
Glucose-Capillary: 77 mg/dL (ref 65–99)

## 2015-10-23 DIAGNOSIS — E1129 Type 2 diabetes mellitus with other diabetic kidney complication: Secondary | ICD-10-CM | POA: Diagnosis not present

## 2015-10-23 LAB — GLUCOSE, CAPILLARY
Glucose-Capillary: 53 mg/dL — ABNORMAL LOW (ref 65–99)
Glucose-Capillary: 93 mg/dL (ref 65–99)
Glucose-Capillary: 109 mg/dL — ABNORMAL HIGH (ref 65–99)

## 2015-10-24 DIAGNOSIS — E1129 Type 2 diabetes mellitus with other diabetic kidney complication: Secondary | ICD-10-CM | POA: Diagnosis not present

## 2015-10-24 LAB — GLUCOSE, CAPILLARY
Glucose-Capillary: 112 mg/dL — ABNORMAL HIGH (ref 65–99)
Glucose-Capillary: 144 mg/dL — ABNORMAL HIGH (ref 65–99)
Glucose-Capillary: 117 mg/dL — ABNORMAL HIGH (ref 65–99)

## 2015-10-25 DIAGNOSIS — E1129 Type 2 diabetes mellitus with other diabetic kidney complication: Secondary | ICD-10-CM | POA: Diagnosis not present

## 2015-10-25 LAB — GLUCOSE, CAPILLARY
Glucose-Capillary: 90 mg/dL (ref 65–99)
Glucose-Capillary: 183 mg/dL — ABNORMAL HIGH (ref 65–99)

## 2015-10-26 DIAGNOSIS — E1129 Type 2 diabetes mellitus with other diabetic kidney complication: Secondary | ICD-10-CM | POA: Diagnosis not present

## 2015-10-26 LAB — GLUCOSE, CAPILLARY
GLUCOSE-CAPILLARY: 107 mg/dL — AB (ref 65–99)
Glucose-Capillary: 90 mg/dL (ref 65–99)

## 2015-10-27 LAB — GLUCOSE, CAPILLARY
Glucose-Capillary: 89 mg/dL (ref 65–99)
Glucose-Capillary: 260 mg/dL — ABNORMAL HIGH (ref 65–99)
Glucose-Capillary: 229 mg/dL — ABNORMAL HIGH (ref 65–99)

## 2015-10-28 DIAGNOSIS — E1129 Type 2 diabetes mellitus with other diabetic kidney complication: Secondary | ICD-10-CM | POA: Diagnosis not present

## 2015-10-28 LAB — GLUCOSE, CAPILLARY
Glucose-Capillary: 177 mg/dL — ABNORMAL HIGH (ref 65–99)
Glucose-Capillary: 115 mg/dL — ABNORMAL HIGH (ref 65–99)

## 2015-10-29 DIAGNOSIS — E1129 Type 2 diabetes mellitus with other diabetic kidney complication: Secondary | ICD-10-CM | POA: Diagnosis not present

## 2015-10-29 LAB — GLUCOSE, CAPILLARY
Glucose-Capillary: 144 mg/dL — ABNORMAL HIGH (ref 65–99)
Glucose-Capillary: 147 mg/dL — ABNORMAL HIGH (ref 65–99)

## 2015-10-30 DIAGNOSIS — E1129 Type 2 diabetes mellitus with other diabetic kidney complication: Secondary | ICD-10-CM | POA: Diagnosis not present

## 2015-10-30 LAB — GLUCOSE, CAPILLARY
GLUCOSE-CAPILLARY: 150 mg/dL — AB (ref 65–99)
GLUCOSE-CAPILLARY: 195 mg/dL — AB (ref 65–99)

## 2015-10-31 DIAGNOSIS — E1129 Type 2 diabetes mellitus with other diabetic kidney complication: Secondary | ICD-10-CM | POA: Diagnosis not present

## 2015-10-31 LAB — GLUCOSE, CAPILLARY
Glucose-Capillary: 98 mg/dL (ref 65–99)
Glucose-Capillary: 224 mg/dL — ABNORMAL HIGH (ref 65–99)

## 2015-11-01 DIAGNOSIS — E1129 Type 2 diabetes mellitus with other diabetic kidney complication: Secondary | ICD-10-CM | POA: Diagnosis not present

## 2015-11-01 LAB — GLUCOSE, CAPILLARY
Glucose-Capillary: 84 mg/dL (ref 65–99)
Glucose-Capillary: 130 mg/dL — ABNORMAL HIGH (ref 65–99)

## 2015-11-02 DIAGNOSIS — E1129 Type 2 diabetes mellitus with other diabetic kidney complication: Secondary | ICD-10-CM | POA: Diagnosis not present

## 2015-11-02 LAB — GLUCOSE, CAPILLARY
GLUCOSE-CAPILLARY: 140 mg/dL — AB (ref 65–99)
Glucose-Capillary: 159 mg/dL — ABNORMAL HIGH (ref 65–99)

## 2015-11-03 DIAGNOSIS — E1129 Type 2 diabetes mellitus with other diabetic kidney complication: Secondary | ICD-10-CM | POA: Diagnosis not present

## 2015-11-03 LAB — GLUCOSE, CAPILLARY
Glucose-Capillary: 109 mg/dL — ABNORMAL HIGH (ref 65–99)
Glucose-Capillary: 81 mg/dL (ref 65–99)

## 2015-11-04 DIAGNOSIS — E1129 Type 2 diabetes mellitus with other diabetic kidney complication: Secondary | ICD-10-CM | POA: Diagnosis not present

## 2015-11-04 LAB — GLUCOSE, CAPILLARY
GLUCOSE-CAPILLARY: 228 mg/dL — AB (ref 65–99)
Glucose-Capillary: 42 mg/dL — CL (ref 65–99)
Glucose-Capillary: 58 mg/dL — ABNORMAL LOW (ref 65–99)

## 2015-11-05 DIAGNOSIS — E1129 Type 2 diabetes mellitus with other diabetic kidney complication: Secondary | ICD-10-CM | POA: Diagnosis not present

## 2015-11-05 LAB — GLUCOSE, CAPILLARY
GLUCOSE-CAPILLARY: 234 mg/dL — AB (ref 65–99)
Glucose-Capillary: 84 mg/dL (ref 65–99)
Glucose-Capillary: 148 mg/dL — ABNORMAL HIGH (ref 65–99)

## 2015-11-06 DIAGNOSIS — E1129 Type 2 diabetes mellitus with other diabetic kidney complication: Secondary | ICD-10-CM | POA: Diagnosis not present

## 2015-11-06 LAB — GLUCOSE, CAPILLARY
Glucose-Capillary: 109 mg/dL — ABNORMAL HIGH (ref 65–99)
Glucose-Capillary: 141 mg/dL — ABNORMAL HIGH (ref 65–99)

## 2015-11-07 DIAGNOSIS — E1129 Type 2 diabetes mellitus with other diabetic kidney complication: Secondary | ICD-10-CM | POA: Diagnosis not present

## 2015-11-07 LAB — GLUCOSE, CAPILLARY: Glucose-Capillary: 170 mg/dL — ABNORMAL HIGH (ref 65–99)

## 2015-11-08 DIAGNOSIS — E1129 Type 2 diabetes mellitus with other diabetic kidney complication: Secondary | ICD-10-CM | POA: Diagnosis not present

## 2015-11-08 LAB — GLUCOSE, CAPILLARY
Glucose-Capillary: 125 mg/dL — ABNORMAL HIGH (ref 65–99)
Glucose-Capillary: 160 mg/dL — ABNORMAL HIGH (ref 65–99)

## 2015-11-09 DIAGNOSIS — E1129 Type 2 diabetes mellitus with other diabetic kidney complication: Secondary | ICD-10-CM | POA: Diagnosis not present

## 2015-11-09 LAB — GLUCOSE, CAPILLARY
GLUCOSE-CAPILLARY: 158 mg/dL — AB (ref 65–99)
Glucose-Capillary: 62 mg/dL — ABNORMAL LOW (ref 65–99)

## 2015-11-10 DIAGNOSIS — E1129 Type 2 diabetes mellitus with other diabetic kidney complication: Secondary | ICD-10-CM | POA: Diagnosis not present

## 2015-11-10 LAB — GLUCOSE, CAPILLARY
Glucose-Capillary: 160 mg/dL — ABNORMAL HIGH (ref 65–99)
Glucose-Capillary: 112 mg/dL — ABNORMAL HIGH (ref 65–99)

## 2015-11-11 DIAGNOSIS — E1129 Type 2 diabetes mellitus with other diabetic kidney complication: Secondary | ICD-10-CM | POA: Diagnosis not present

## 2015-11-11 LAB — GLUCOSE, CAPILLARY
Glucose-Capillary: 93 mg/dL (ref 65–99)
Glucose-Capillary: 227 mg/dL — ABNORMAL HIGH (ref 65–99)

## 2015-11-12 DIAGNOSIS — E1129 Type 2 diabetes mellitus with other diabetic kidney complication: Secondary | ICD-10-CM | POA: Diagnosis not present

## 2015-11-12 LAB — GLUCOSE, CAPILLARY
GLUCOSE-CAPILLARY: 150 mg/dL — AB (ref 65–99)
Glucose-Capillary: 70 mg/dL (ref 65–99)

## 2015-11-13 DIAGNOSIS — E1129 Type 2 diabetes mellitus with other diabetic kidney complication: Secondary | ICD-10-CM | POA: Diagnosis not present

## 2015-11-13 LAB — GLUCOSE, CAPILLARY
GLUCOSE-CAPILLARY: 118 mg/dL — AB (ref 65–99)
GLUCOSE-CAPILLARY: 144 mg/dL — AB (ref 65–99)

## 2015-11-14 DIAGNOSIS — E1129 Type 2 diabetes mellitus with other diabetic kidney complication: Secondary | ICD-10-CM | POA: Diagnosis not present

## 2015-11-14 LAB — GLUCOSE, CAPILLARY
GLUCOSE-CAPILLARY: 198 mg/dL — AB (ref 65–99)
Glucose-Capillary: 109 mg/dL — ABNORMAL HIGH (ref 65–99)

## 2015-11-15 ENCOUNTER — Encounter
Admission: RE | Admit: 2015-11-15 | Discharge: 2015-11-15 | Disposition: A | Discharge status: Home / Self Care | Source: Ambulatory Visit | Attending: Internal Medicine | Admitting: Internal Medicine

## 2015-11-15 DIAGNOSIS — E1129 Type 2 diabetes mellitus with other diabetic kidney complication: Secondary | ICD-10-CM | POA: Insufficient documentation

## 2015-11-15 LAB — GLUCOSE, CAPILLARY
GLUCOSE-CAPILLARY: 78 mg/dL (ref 65–99)
GLUCOSE-CAPILLARY: 228 mg/dL — AB (ref 65–99)

## 2015-11-16 ENCOUNTER — Non-Acute Institutional Stay (SKILLED_NURSING_FACILITY): Payer: Medicare Other | Admitting: Gerontology

## 2015-11-16 DIAGNOSIS — B373 Candidiasis of vulva and vagina: Secondary | ICD-10-CM

## 2015-11-16 DIAGNOSIS — B9689 Other specified bacterial agents as the cause of diseases classified elsewhere: Secondary | ICD-10-CM | POA: Diagnosis not present

## 2015-11-16 DIAGNOSIS — B3731 Acute candidiasis of vulva and vagina: Secondary | ICD-10-CM

## 2015-11-16 DIAGNOSIS — N760 Acute vaginitis: Secondary | ICD-10-CM

## 2015-11-17 NOTE — Progress Notes (Signed)
Location:      Place of Service:  SNF (31) Provider:  Lorenso Quarry, NP-C  Lauro Regulus., MD  Patient Care Team: Lauro Regulus, MD as PCP - General (Internal Medicine)  Extended Emergency Contact Information Primary Emergency Contact: Rudd,Robin Address: [address]          [place], [place] [address] [place] of [place] Home Phone: [phone] Relation: Daughter Secondary Emergency Contact: Cecilie Kicks Address: [address] [place], [place] [address] [place] of [place] Home Phone: [phone] Relation: Daughter  Code Status:  dnr Goals of care: Advanced Directive information Advanced Directives 03/18/2015  Does patient have an advance directive? Yes  Type of Advance Directive Out of facility DNR (pink MOST or yellow form)     Chief Complaint  Patient presents with  . Acute Visit    HPI:  Pt is a 82 y.o. female seen today for an acute visit for vaginal irritation. Staff noticed vaginal area appeared red, inflamed and irritated. Pt was attempting to scratch at the areas. Staff also noticed discharge with a foul odor. Pt is severely demented so further ROS was difficult.     Past Medical History:  Diagnosis Date  . Collagen vascular disease (HCC)   . COPD (chronic obstructive pulmonary disease) (HCC)   . Coronary artery disease   . Diabetes mellitus without complication (HCC)   . Hyperlipidemia   . Hypertension   . Renal disorder    Past Surgical History:  Procedure Laterality Date  . CESAREAN SECTION      No Known Allergies    Medication List       Accurate as of 11/16/15 11:59 PM. Always use your most recent med list.          acetaminophen 325 MG tablet Commonly known as:  TYLENOL Take 650 mg by mouth every 8 (eight) hours.   budesonide-formoterol 160-4.5 MCG/ACT inhaler Commonly known as:  SYMBICORT Inhale 2 puffs into the lungs every 12 (twelve) hours.   calcium carbonate 1500 (600 Ca) MG Tabs  tablet Commonly known as:  OSCAL Take 1,500 mg by mouth 2 (two) times daily with a meal.   cholecalciferol 1000 units tablet Commonly known as:  VITAMIN D Take 2,000 Units by mouth daily.   cyanocobalamin 1000 MCG/ML injection Commonly known as:  (VITAMIN B-12) Inject 1,000 mcg into the muscle every 28 (twenty-eight) days.   dicyclomine 20 MG tablet Commonly known as:  BENTYL Take 20 mg by mouth 3 (three) times daily.   DULoxetine 60 MG capsule Commonly known as:  CYMBALTA Take 60 mg by mouth daily.   fluticasone 50 MCG/ACT nasal spray Commonly known as:  FLONASE Place 2 sprays into both nostrils at bedtime.   glipiZIDE 5 MG tablet Commonly known as:  GLUCOTROL Take 5 mg by mouth 2 (two) times daily.   guaiFENesin 100 MG/5ML liquid Commonly known as:  ROBITUSSIN Take 200 mg by mouth every 4 (four) hours as needed for cough.   insulin NPH Human 100 UNIT/ML injection Commonly known as:  HUMULIN N,NOVOLIN N Inject 10-22 Units into the skin 2 (two) times daily. Give 22 units every morning and 10 units in the evening.   insulin regular 100 units/mL injection Commonly known as:  NOVOLIN R,HUMULIN R Inject 3-8 Units into the skin as needed for high blood sugar. Per sliding scale: If blood sugar 251-325 give 3 units, 326-450 give 6 units, >450 give 8 units  loperamide 2 MG capsule Commonly known as:  IMODIUM Take 2-4 mg by mouth as needed for diarrhea or loose stools. Reported on 03/18/2015   LORazepam 0.5 MG tablet Commonly known as:  ATIVAN Take 0.5 mg by mouth every 4 (four) hours as needed for anxiety.   metoCLOPramide 5 MG tablet Commonly known as:  REGLAN Take 5 mg by mouth 3 (three) times daily.   metoprolol tartrate 25 MG tablet Commonly known as:  LOPRESSOR Take 25 mg by mouth 2 (two) times daily.   montelukast 10 MG tablet Commonly known as:  SINGULAIR Take 10 mg by mouth at bedtime.   pantoprazole 40 MG tablet Commonly known as:  PROTONIX Take 40 mg by  mouth 2 (two) times daily.   potassium chloride 20 MEQ packet Commonly known as:  KLOR-CON Take 20 mEq by mouth daily.   pravastatin 20 MG tablet Commonly known as:  PRAVACHOL Take 20 mg by mouth at bedtime.   PREPARATION H 0.25-14-74.9 % rectal ointment Generic drug:  phenylephrine-shark liver oil-mineral oil-petrolatum Place 1 application rectally as needed for hemorrhoids.   promethazine 25 MG tablet Commonly known as:  PHENERGAN Take 25 mg by mouth every 4 (four) hours as needed for nausea or vomiting.   rivaroxaban 20 MG Tabs tablet Commonly known as:  XARELTO Take 20 mg by mouth daily.   scopolamine 1 MG/3DAYS Commonly known as:  TRANSDERM-SCOP Place 1 patch onto the skin every 3 (three) days.   senna-docusate 8.6-50 MG tablet Commonly known as:  Senokot-S Take 1 tablet by mouth 2 (two) times daily.   sodium chloride 0.65 % Soln nasal spray Commonly known as:  OCEAN Place 2 sprays into both nostrils as needed for congestion.   tiotropium 18 MCG inhalation capsule Commonly known as:  SPIRIVA Place 18 mcg into inhaler and inhale daily.   torsemide 10 MG tablet Commonly known as:  DEMADEX Take 15 mg by mouth daily.   traMADol 50 MG tablet Commonly known as:  ULTRAM Take 50 mg by mouth every 4 (four) hours as needed.   trolamine salicylate 10 % cream Commonly known as:  ASPERCREME Apply 1 application topically 4 (four) times daily.   witch hazel-glycerin pad Commonly known as:  TUCKS Apply 1-2 application topically as needed for itching.       Review of Systems  Unable to perform ROS: Dementia  Constitutional: Negative for activity change, chills and fever.  Gastrointestinal: Negative for abdominal distention, diarrhea and nausea.  Genitourinary: Positive for vaginal discharge. Negative for dysuria, enuresis, hematuria and vaginal bleeding.     There is no immunization history on file for this patient. Pertinent  Health Maintenance Due  Topic Date  Due  . FOOT EXAM  01/10/1936  . OPHTHALMOLOGY EXAM  01/10/1936  . URINE MICROALBUMIN  01/10/1936  . DEXA SCAN  01/10/1993  . PNA vac Low Risk Adult (1 of 2 - PCV13) 01/10/1993  . INFLUENZA VACCINE  09/15/2015  . HEMOGLOBIN A1C  09/16/2015   No flowsheet data found. Functional Status Survey:    Vitals:   11/10/15 0600  BP: 127/81  Pulse: 88  Resp: 20  SpO2: 98%   There is no height or weight on file to calculate BMI. Physical Exam  Constitutional: She appears well-developed and well-nourished.  Abdominal: Soft. Normal appearance and bowel sounds are normal. There is no tenderness.  Genitourinary: There is rash on the right labia. There is rash on the left labia. There is erythema in the vagina. Vaginal  discharge found.    Labs reviewed:  Recent Labs  03/18/15 0820 03/19/15 0525 07/30/15 2330  NA 139 143 140  K 3.9 3.6 3.4*  CL 105 111 101  CO2 20* 22 30  GLUCOSE 226* 157* 57*  BUN 14 13 21*  CREATININE 0.79 0.66 0.83  CALCIUM 8.5* 8.1* 9.5    Recent Labs  02/27/15 0340 03/18/15 0820 03/19/15 0525  AST 10* 11* 10*  ALT 9* 9* 7*  ALKPHOS 55 60 50  BILITOT 0.5 1.5* 1.2  PROT 6.9 6.9 5.8*  ALBUMIN 3.1* 3.0* 2.4*    Recent Labs  02/27/15 0340 03/18/15 0820 03/19/15 0525 07/30/15 2330  WBC 16.0* 21.0* 15.0* 13.7*  NEUTROABS 10.1* 17.3*  --  8.4*  HGB 12.7 12.9 11.5* 12.4  HCT 38.8 39.3 35.3 36.9  MCV 98.4 95.9 98.0 95.8  PLT 243 315 272 282   Lab Results  Component Value Date   TSH 4.600 (H) 11/21/2014   Lab Results  Component Value Date   HGBA1C 7.3 (H) 03/19/2015   Lab Results  Component Value Date   CHOL 136 09/03/2013   HDL 54 09/03/2013   LDLCALC 56 09/03/2013   TRIG 130 09/03/2013   CHOLHDL 2.6 12/19/2006    Significant Diagnostic Results in last 30 days:  No results found.  Assessment/Plan 1. Vaginal yeast infection  Gynazole 1 - 2% cream- one applicatorful x 1 at HS  2. Bacterial vaginosis  Metrogel .75- 1 applicatorful  Q HS x 5 days  Family/ staff Communication:   Total Time:  Documentation:  Face to Face:  Family/Phone:   Labs/tests ordered:    Medication list reviewed and assessed for continued appropriateness.  Brynda Rim, NP-C Geriatrics [facility] [phone] [address], [place] [address] Cell Phone (Mon-Fri 8am-5pm):  [phone] On Call:  [phone] & follow prompts after 5pm & weekends Office Phone:  [phone] Office Fax:  [phone]

## 2015-12-07 LAB — GLUCOSE, CAPILLARY
GLUCOSE-CAPILLARY: 95 mg/dL (ref 65–99)
Glucose-Capillary: 66 mg/dL (ref 65–99)

## 2015-12-08 LAB — GLUCOSE, CAPILLARY
GLUCOSE-CAPILLARY: 212 mg/dL — AB (ref 65–99)
Glucose-Capillary: 117 mg/dL — ABNORMAL HIGH (ref 65–99)
Glucose-Capillary: 118 mg/dL — ABNORMAL HIGH (ref 65–99)

## 2015-12-09 LAB — GLUCOSE, CAPILLARY
GLUCOSE-CAPILLARY: 41 mg/dL — AB (ref 65–99)
Glucose-Capillary: 35 mg/dL — CL (ref 65–99)
Glucose-Capillary: 110 mg/dL — ABNORMAL HIGH (ref 65–99)
Glucose-Capillary: 79 mg/dL (ref 65–99)

## 2015-12-10 LAB — GLUCOSE, CAPILLARY
Glucose-Capillary: 74 mg/dL (ref 65–99)
Glucose-Capillary: 125 mg/dL — ABNORMAL HIGH (ref 65–99)

## 2015-12-11 LAB — GLUCOSE, CAPILLARY
GLUCOSE-CAPILLARY: 215 mg/dL — AB (ref 65–99)
Glucose-Capillary: 115 mg/dL — ABNORMAL HIGH (ref 65–99)

## 2015-12-12 LAB — GLUCOSE, CAPILLARY
GLUCOSE-CAPILLARY: 181 mg/dL — AB (ref 65–99)
Glucose-Capillary: 124 mg/dL — ABNORMAL HIGH (ref 65–99)

## 2015-12-13 LAB — GLUCOSE, CAPILLARY
GLUCOSE-CAPILLARY: 171 mg/dL — AB (ref 65–99)
Glucose-Capillary: 161 mg/dL — ABNORMAL HIGH (ref 65–99)

## 2015-12-14 LAB — GLUCOSE, CAPILLARY
GLUCOSE-CAPILLARY: 90 mg/dL (ref 65–99)
GLUCOSE-CAPILLARY: 189 mg/dL — AB (ref 65–99)

## 2015-12-15 LAB — GLUCOSE, CAPILLARY
Glucose-Capillary: 100 mg/dL — ABNORMAL HIGH (ref 65–99)
Glucose-Capillary: 89 mg/dL (ref 65–99)

## 2015-12-16 ENCOUNTER — Encounter
Admission: RE | Admit: 2015-12-16 | Discharge: 2015-12-16 | Disposition: A | Discharge status: Home / Self Care | Payer: Medicare Other | Source: Ambulatory Visit | Attending: Internal Medicine | Admitting: Internal Medicine

## 2015-12-17 LAB — GLUCOSE, CAPILLARY: GLUCOSE-CAPILLARY: 114 mg/dL — AB (ref 65–99)

## 2015-12-18 LAB — GLUCOSE, CAPILLARY
Glucose-Capillary: 59 mg/dL — ABNORMAL LOW (ref 65–99)
Glucose-Capillary: 100 mg/dL — ABNORMAL HIGH (ref 65–99)
Glucose-Capillary: 109 mg/dL — ABNORMAL HIGH (ref 65–99)

## 2015-12-19 LAB — GLUCOSE, CAPILLARY
GLUCOSE-CAPILLARY: 99 mg/dL (ref 65–99)
Glucose-Capillary: 102 mg/dL — ABNORMAL HIGH (ref 65–99)

## 2015-12-20 LAB — GLUCOSE, CAPILLARY
GLUCOSE-CAPILLARY: 157 mg/dL — AB (ref 65–99)
Glucose-Capillary: 100 mg/dL — ABNORMAL HIGH (ref 65–99)

## 2015-12-21 LAB — GLUCOSE, CAPILLARY
GLUCOSE-CAPILLARY: 117 mg/dL — AB (ref 65–99)
GLUCOSE-CAPILLARY: 149 mg/dL — AB (ref 65–99)

## 2015-12-22 LAB — GLUCOSE, CAPILLARY: Glucose-Capillary: 55 mg/dL — ABNORMAL LOW (ref 65–99)

## 2015-12-23 LAB — GLUCOSE, CAPILLARY
GLUCOSE-CAPILLARY: 145 mg/dL — AB (ref 65–99)
Glucose-Capillary: 155 mg/dL — ABNORMAL HIGH (ref 65–99)

## 2015-12-24 LAB — GLUCOSE, CAPILLARY
GLUCOSE-CAPILLARY: 79 mg/dL (ref 65–99)
Glucose-Capillary: 114 mg/dL — ABNORMAL HIGH (ref 65–99)

## 2015-12-25 LAB — GLUCOSE, CAPILLARY: Glucose-Capillary: 51 mg/dL — ABNORMAL LOW (ref 65–99)

## 2015-12-26 LAB — GLUCOSE, CAPILLARY: Glucose-Capillary: 164 mg/dL — ABNORMAL HIGH (ref 65–99)

## 2015-12-27 LAB — GLUCOSE, CAPILLARY: GLUCOSE-CAPILLARY: 221 mg/dL — AB (ref 65–99)

## 2015-12-28 LAB — GLUCOSE, CAPILLARY
GLUCOSE-CAPILLARY: 157 mg/dL — AB (ref 65–99)
GLUCOSE-CAPILLARY: 141 mg/dL — AB (ref 65–99)

## 2015-12-29 LAB — GLUCOSE, CAPILLARY
Glucose-Capillary: 164 mg/dL — ABNORMAL HIGH (ref 65–99)
Glucose-Capillary: 175 mg/dL — ABNORMAL HIGH (ref 65–99)

## 2015-12-30 LAB — GLUCOSE, CAPILLARY
GLUCOSE-CAPILLARY: 58 mg/dL — AB (ref 65–99)
GLUCOSE-CAPILLARY: 112 mg/dL — AB (ref 65–99)

## 2015-12-31 LAB — GLUCOSE, CAPILLARY: Glucose-Capillary: 95 mg/dL (ref 65–99)

## 2016-01-01 LAB — GLUCOSE, CAPILLARY
GLUCOSE-CAPILLARY: 96 mg/dL (ref 65–99)
GLUCOSE-CAPILLARY: 95 mg/dL (ref 65–99)

## 2016-01-02 LAB — GLUCOSE, CAPILLARY: Glucose-Capillary: 100 mg/dL — ABNORMAL HIGH (ref 65–99)

## 2016-01-03 LAB — GLUCOSE, CAPILLARY
Glucose-Capillary: 49 mg/dL — ABNORMAL LOW (ref 65–99)
Glucose-Capillary: 112 mg/dL — ABNORMAL HIGH (ref 65–99)

## 2016-01-04 LAB — GLUCOSE, CAPILLARY: Glucose-Capillary: 120 mg/dL — ABNORMAL HIGH (ref 65–99)

## 2016-01-05 LAB — GLUCOSE, CAPILLARY: GLUCOSE-CAPILLARY: 73 mg/dL (ref 65–99)

## 2016-01-06 LAB — GLUCOSE, CAPILLARY: GLUCOSE-CAPILLARY: 148 mg/dL — AB (ref 65–99)

## 2016-01-07 LAB — GLUCOSE, CAPILLARY
Glucose-Capillary: 130 mg/dL — ABNORMAL HIGH (ref 65–99)
Glucose-Capillary: 178 mg/dL — ABNORMAL HIGH (ref 65–99)

## 2016-01-09 LAB — GLUCOSE, CAPILLARY: GLUCOSE-CAPILLARY: 91 mg/dL (ref 65–99)

## 2016-01-10 LAB — GLUCOSE, CAPILLARY: GLUCOSE-CAPILLARY: 121 mg/dL — AB (ref 65–99)

## 2016-01-11 LAB — GLUCOSE, CAPILLARY
Glucose-Capillary: 68 mg/dL (ref 65–99)
Glucose-Capillary: 175 mg/dL — ABNORMAL HIGH (ref 65–99)

## 2016-01-12 LAB — GLUCOSE, CAPILLARY
GLUCOSE-CAPILLARY: 117 mg/dL — AB (ref 65–99)
GLUCOSE-CAPILLARY: 118 mg/dL — AB (ref 65–99)

## 2016-01-13 LAB — GLUCOSE, CAPILLARY: GLUCOSE-CAPILLARY: 95 mg/dL (ref 65–99)

## 2016-01-14 LAB — GLUCOSE, CAPILLARY: GLUCOSE-CAPILLARY: 50 mg/dL — AB (ref 65–99)

## 2016-01-15 ENCOUNTER — Non-Acute Institutional Stay (SKILLED_NURSING_FACILITY): Payer: Medicare Other | Admitting: Gerontology

## 2016-01-15 ENCOUNTER — Encounter
Admission: RE | Admit: 2016-01-15 | Discharge: 2016-01-15 | Disposition: A | Discharge status: Home / Self Care | Payer: Medicare Other | Source: Ambulatory Visit | Attending: Internal Medicine | Admitting: Internal Medicine

## 2016-01-15 DIAGNOSIS — K920 Hematemesis: Secondary | ICD-10-CM

## 2016-01-15 LAB — GLUCOSE, CAPILLARY
GLUCOSE-CAPILLARY: 87 mg/dL (ref 65–99)
Glucose-Capillary: 157 mg/dL — ABNORMAL HIGH (ref 65–99)

## 2016-01-15 NOTE — Progress Notes (Signed)
Location:      Place of Service:  SNF (31) Provider:  Lorenso QuarryShannon Reis Goga, NP-C  Lauro RegulusANDERSON,MARSHALL W., MD  Patient Care Team: Lauro RegulusMarshall W Anderson, MD as PCP - General (Internal Medicine)  Extended Emergency Contact Information Primary Emergency Contact: Rudd,Robin Address: [address]          [place], [place] [address] [place] Home Phone: [phone] Relation: Daughter Secondary Emergency Contact: Cecilie KicksKernodle,Jean Address: [address] [place]          [place], [place] [address] [place] Home Phone: [phone] Relation: Daughter  Code Status:  dnr Goals of care: Advanced Directive information Advanced Directives 03/18/2015  Does Patient Have a Medical Advance Directive? Yes  Type of Advance Directive Out of facility DNR (pink MOST or yellow form)     Chief Complaint  Patient presents with  . Acute Visit    HPI:  Pt is a 82 y.o. female seen today for an acute visit for vomiting. Hospice aid brought to my attention pt had vomited copious amounts of "coffee-ground" emesis. Vomit had been cleaned up and disposed of before I could assess it. On exam, pt was asleep, fairly easy to arouse. Confused. Incoherent words. Lethargic.- pt had received Phenergan injection prior to my arrival at bedside. Pt has advanced/ end-stage dementia. Unable to give ROS d/t dementia. VSS.   Past Medical History:  Diagnosis Date  . Collagen vascular disease (HCC)   . COPD (chronic obstructive pulmonary disease) (HCC)   . Coronary artery disease   . Diabetes mellitus without complication (HCC)   . Hyperlipidemia   . Hypertension   . Renal disorder    Past Surgical History:  Procedure Laterality Date  . CESAREAN SECTION      No Known Allergies    Medication List       Accurate as of 01/15/16 10:44 PM. Always use your most recent med list.          acetaminophen 325 MG tablet Commonly known as:  TYLENOL Take 650 mg by mouth every 8 (eight) hours.     budesonide-formoterol 160-4.5 MCG/ACT inhaler Commonly known as:  SYMBICORT Inhale 2 puffs into the lungs every 12 (twelve) hours.   calcium carbonate 1500 (600 Ca) MG Tabs tablet Commonly known as:  OSCAL Take 1,500 mg by mouth 2 (two) times daily with a meal.   cholecalciferol 1000 units tablet Commonly known as:  VITAMIN D Take 2,000 Units by mouth daily.   cyanocobalamin 1000 MCG/ML injection Commonly known as:  (VITAMIN B-12) Inject 1,000 mcg into the muscle every 28 (twenty-eight) days.   dicyclomine 20 MG tablet Commonly known as:  BENTYL Take 20 mg by mouth 3 (three) times daily.   DULoxetine 60 MG capsule Commonly known as:  CYMBALTA Take 60 mg by mouth daily.   fluticasone 50 MCG/ACT nasal spray Commonly known as:  FLONASE Place 2 sprays into both nostrils at bedtime.   glipiZIDE 5 MG tablet Commonly known as:  GLUCOTROL Take 5 mg by mouth 2 (two) times daily.   guaiFENesin 100 MG/5ML liquid Commonly known as:  ROBITUSSIN Take 200 mg by mouth every 4 (four) hours as needed for cough.   insulin NPH Human 100 UNIT/ML injection Commonly known as:  HUMULIN N,NOVOLIN N Inject 10-22 Units into the skin 2 (two) times daily. Give 22 units every morning and 10 units in the evening.   insulin regular 250 units/2.705mL (100 units/mL) injection Commonly known as:  NOVOLIN R,HUMULIN R Inject  3-8 Units into the skin as needed for high blood sugar. Per sliding scale: If blood sugar 251-325 give 3 units, 326-450 give 6 units, >450 give 8 units   loperamide 2 MG capsule Commonly known as:  IMODIUM Take 2-4 mg by mouth as needed for diarrhea or loose stools. Reported on 03/18/2015   LORazepam 0.5 MG tablet Commonly known as:  ATIVAN Take 0.5 mg by mouth every 4 (four) hours as needed for anxiety.   metoCLOPramide 5 MG tablet Commonly known as:  REGLAN Take 5 mg by mouth 3 (three) times daily.   metoprolol tartrate 25 MG tablet Commonly known as:  LOPRESSOR Take 25 mg  by mouth 2 (two) times daily.   montelukast 10 MG tablet Commonly known as:  SINGULAIR Take 10 mg by mouth at bedtime.   pantoprazole 40 MG tablet Commonly known as:  PROTONIX Take 40 mg by mouth 2 (two) times daily.   potassium chloride 20 MEQ packet Commonly known as:  KLOR-CON Take 20 mEq by mouth daily.   pravastatin 20 MG tablet Commonly known as:  PRAVACHOL Take 20 mg by mouth at bedtime.   PREPARATION H 0.25-14-74.9 % rectal ointment Generic drug:  phenylephrine-shark liver oil-mineral oil-petrolatum Place 1 application rectally as needed for hemorrhoids.   promethazine 25 MG tablet Commonly known as:  PHENERGAN Take 25 mg by mouth every 4 (four) hours as needed for nausea or vomiting.   rivaroxaban 20 MG Tabs tablet Commonly known as:  XARELTO Take 20 mg by mouth daily.   scopolamine 1 MG/3DAYS Commonly known as:  TRANSDERM-SCOP Place 1 patch onto the skin every 3 (three) days.   senna-docusate 8.6-50 MG tablet Commonly known as:  Senokot-S Take 1 tablet by mouth 2 (two) times daily.   sodium chloride 0.65 % Soln nasal spray Commonly known as:  OCEAN Place 2 sprays into both nostrils as needed for congestion.   tiotropium 18 MCG inhalation capsule Commonly known as:  SPIRIVA Place 18 mcg into inhaler and inhale daily.   torsemide 10 MG tablet Commonly known as:  DEMADEX Take 15 mg by mouth daily.   traMADol 50 MG tablet Commonly known as:  ULTRAM Take 50 mg by mouth every 4 (four) hours as needed.   trolamine salicylate 10 % cream Commonly known as:  ASPERCREME Apply 1 application topically 4 (four) times daily.   witch hazel-glycerin pad Commonly known as:  TUCKS Apply 1-2 application topically as needed for itching.       Review of Systems  Unable to perform ROS: Dementia  Constitutional: Negative for activity change, appetite change, chills, diaphoresis and fever.  HENT: Negative for congestion, sneezing, sore throat, trouble swallowing  and voice change.   Respiratory: Negative.  Negative for apnea, cough, choking, chest tightness, shortness of breath and wheezing.   Cardiovascular: Negative.  Negative for chest pain, palpitations and leg swelling.  Gastrointestinal: Positive for nausea and vomiting. Negative for abdominal distention, abdominal pain, anal bleeding, blood in stool, constipation and diarrhea.  Genitourinary: Negative for difficulty urinating, dysuria, frequency, hematuria, urgency and vaginal bleeding.  Musculoskeletal: Negative.  Negative for back pain, gait problem and myalgias. Arthralgias: typical arthritis.  Skin: Negative.  Negative for color change, pallor, rash and wound.  Neurological: Negative for dizziness, tremors, syncope, speech difficulty, weakness, numbness and headaches.  Psychiatric/Behavioral: Positive for confusion and hallucinations. Negative for agitation and behavioral problems.  All other systems reviewed and are negative.    There is no immunization history on file for  this patient. Pertinent  Health Maintenance Due  Topic Date Due  . FOOT EXAM  01/10/1936  . OPHTHALMOLOGY EXAM  01/10/1936  . URINE MICROALBUMIN  01/10/1936  . DEXA SCAN  01/10/1993  . PNA vac Low Risk Adult (1 of 2 - PCV13) 01/10/1993  . INFLUENZA VACCINE  09/15/2015  . HEMOGLOBIN A1C  09/16/2015   No flowsheet data found. Functional Status Survey:    There were no vitals filed for this visit. There is no height or weight on file to calculate BMI. Physical Exam  Constitutional: She is oriented to person, place, and time. Vital signs are normal. She appears well-developed and well-nourished. She is active and cooperative. She appears ill. No distress. Nasal cannula in place.  HENT:  Head: Normocephalic and atraumatic.  Mouth/Throat: Uvula is midline, oropharynx is clear and moist and mucous membranes are normal. Mucous membranes are not pale, not dry and not cyanotic.  Eyes: Conjunctivae, EOM and lids are  normal. Pupils are equal, round, and reactive to light.  Neck: Trachea normal, normal range of motion and full passive range of motion without pain. Neck supple. No JVD present. No tracheal deviation, no edema and no erythema present. No thyromegaly present.  Cardiovascular: Normal rate, regular rhythm, intact distal pulses and normal pulses.  Exam reveals distant heart sounds. Exam reveals no gallop and no friction rub.   No murmur heard. Pulmonary/Chest: Effort normal. No accessory muscle usage. No respiratory distress. She has decreased breath sounds in the right lower field and the left lower field. She has no wheezes. She has no rhonchi. She has no rales. She exhibits no tenderness.  Abdominal: Soft. Normal appearance and bowel sounds are normal. She exhibits no distension and no ascites. There is no tenderness.  Musculoskeletal: Normal range of motion. She exhibits no edema or tenderness.  Expected osteoarthritis, stiffness  Neurological: She is alert and oriented to person, place, and time. She has normal strength.  Skin: Skin is warm, dry and intact. She is not diaphoretic. No cyanosis. No pallor. Nails show no clubbing.  Psychiatric: She has a normal mood and affect. Her speech is normal and behavior is normal. Judgment and thought content normal. Cognition and memory are normal.  Nursing note and vitals reviewed.   Labs reviewed:  Recent Labs  03/18/15 0820 03/19/15 0525 07/30/15 2330  NA 139 143 140  K 3.9 3.6 3.4*  CL 105 111 101  CO2 20* 22 30  GLUCOSE 226* 157* 57*  BUN 14 13 21*  CREATININE 0.79 0.66 0.83  CALCIUM 8.5* 8.1* 9.5    Recent Labs  02/27/15 0340 03/18/15 0820 03/19/15 0525  AST 10* 11* 10*  ALT 9* 9* 7*  ALKPHOS 55 60 50  BILITOT 0.5 1.5* 1.2  PROT 6.9 6.9 5.8*  ALBUMIN 3.1* 3.0* 2.4*    Recent Labs  02/27/15 0340 03/18/15 0820 03/19/15 0525 07/30/15 2330  WBC 16.0* 21.0* 15.0* 13.7*  NEUTROABS 10.1* 17.3*  --  8.4*  HGB 12.7 12.9 11.5*  12.4  HCT 38.8 39.3 35.3 36.9  MCV 98.4 95.9 98.0 95.8  PLT 243 315 272 282   Lab Results  Component Value Date   TSH 4.600 (H) 11/21/2014   Lab Results  Component Value Date   HGBA1C 7.3 (H) 03/19/2015   Lab Results  Component Value Date   CHOL 136 09/03/2013   HDL 54 09/03/2013   LDLCALC 56 09/03/2013   TRIG 130 09/03/2013   CHOLHDL 2.6 12/19/2006    Significant  Diagnostic Results in last 30 days:  No results found.  Assessment/Plan 1. Hematemesis, presence of nausea not specified  Omeprazole 40 mg po BID  DC Xarelto  DC non-essential meds for comfort  Zofran 4 mg po Q 4 hours prn  Phenergan 12.5-25 mg IM Q 6 hours prn    Family/ staff Communication:   Total Time:  Documentation:  Face to Face:  Family/Phone:   Labs/tests ordered:    Medication list reviewed and assessed for continued appropriateness.  Brynda RimShannon H. Tulio Facundo, NP-C Geriatrics [facility] [phone] [address] [place] , [place] [address] Cell Phone (Mon-Fri 8am-5pm):  [phone] On Call:  [phone] & follow prompts after 5pm & weekends Office Phone:  [phone] Office Fax:  [phone]

## 2016-01-18 ENCOUNTER — Encounter: Payer: Self-pay | Admitting: Internal Medicine

## 2016-01-18 ENCOUNTER — Inpatient Hospital Stay
Admission: EM | Admit: 2016-01-18 | Discharge: 2016-02-15 | DRG: 871 | Disposition: E | Attending: Internal Medicine | Admitting: Internal Medicine

## 2016-01-18 ENCOUNTER — Emergency Department

## 2016-01-18 DIAGNOSIS — I214 Non-ST elevation (NSTEMI) myocardial infarction: Secondary | ICD-10-CM | POA: Diagnosis present

## 2016-01-18 DIAGNOSIS — K920 Hematemesis: Secondary | ICD-10-CM | POA: Diagnosis present

## 2016-01-18 DIAGNOSIS — I2510 Atherosclerotic heart disease of native coronary artery without angina pectoris: Secondary | ICD-10-CM | POA: Diagnosis present

## 2016-01-18 DIAGNOSIS — Z515 Encounter for palliative care: Secondary | ICD-10-CM | POA: Diagnosis present

## 2016-01-18 DIAGNOSIS — J449 Chronic obstructive pulmonary disease, unspecified: Secondary | ICD-10-CM | POA: Diagnosis present

## 2016-01-18 DIAGNOSIS — Z833 Family history of diabetes mellitus: Secondary | ICD-10-CM

## 2016-01-18 DIAGNOSIS — Z794 Long term (current) use of insulin: Secondary | ICD-10-CM

## 2016-01-18 DIAGNOSIS — Z79899 Other long term (current) drug therapy: Secondary | ICD-10-CM | POA: Diagnosis not present

## 2016-01-18 DIAGNOSIS — N19 Unspecified kidney failure: Secondary | ICD-10-CM

## 2016-01-18 DIAGNOSIS — I472 Ventricular tachycardia: Secondary | ICD-10-CM | POA: Diagnosis present

## 2016-01-18 DIAGNOSIS — N390 Urinary tract infection, site not specified: Secondary | ICD-10-CM | POA: Diagnosis present

## 2016-01-18 DIAGNOSIS — R778 Other specified abnormalities of plasma proteins: Secondary | ICD-10-CM | POA: Diagnosis present

## 2016-01-18 DIAGNOSIS — I10 Essential (primary) hypertension: Secondary | ICD-10-CM | POA: Diagnosis present

## 2016-01-18 DIAGNOSIS — N179 Acute kidney failure, unspecified: Secondary | ICD-10-CM | POA: Diagnosis present

## 2016-01-18 DIAGNOSIS — A419 Sepsis, unspecified organism: Secondary | ICD-10-CM | POA: Diagnosis present

## 2016-01-18 DIAGNOSIS — R509 Fever, unspecified: Secondary | ICD-10-CM

## 2016-01-18 DIAGNOSIS — I959 Hypotension, unspecified: Secondary | ICD-10-CM | POA: Diagnosis present

## 2016-01-18 DIAGNOSIS — Z66 Do not resuscitate: Secondary | ICD-10-CM | POA: Diagnosis present

## 2016-01-18 DIAGNOSIS — R739 Hyperglycemia, unspecified: Secondary | ICD-10-CM

## 2016-01-18 DIAGNOSIS — E1165 Type 2 diabetes mellitus with hyperglycemia: Secondary | ICD-10-CM | POA: Diagnosis present

## 2016-01-18 DIAGNOSIS — R7989 Other specified abnormal findings of blood chemistry: Secondary | ICD-10-CM

## 2016-01-18 DIAGNOSIS — E860 Dehydration: Secondary | ICD-10-CM | POA: Diagnosis not present

## 2016-01-18 DIAGNOSIS — K922 Gastrointestinal hemorrhage, unspecified: Secondary | ICD-10-CM | POA: Diagnosis present

## 2016-01-18 DIAGNOSIS — R000 Tachycardia, unspecified: Secondary | ICD-10-CM

## 2016-01-18 LAB — CBC WITH DIFFERENTIAL/PLATELET
BASOS ABS: 0 10*3/uL (ref 0–0.1)
Basophils Relative: 0 %
Eosinophils Absolute: 0 10*3/uL (ref 0–0.7)
Eosinophils Relative: 0 %
HCT: 38.8 % (ref 35.0–47.0)
HEMOGLOBIN: 12.9 g/dL (ref 12.0–16.0)
LYMPHS ABS: 1.9 10*3/uL (ref 1.0–3.6)
LYMPHS PCT: 9 %
MCH: 32.9 pg (ref 26.0–34.0)
MCHC: 33.4 g/dL (ref 32.0–36.0)
MCV: 98.6 fL (ref 80.0–100.0)
Monocytes Absolute: 0.5 10*3/uL (ref 0.2–0.9)
Monocytes Relative: 3 %
NEUTROS PCT: 88 %
Neutro Abs: 18.7 10*3/uL — ABNORMAL HIGH (ref 1.4–6.5)
Platelets: 359 10*3/uL (ref 150–440)
RBC: 3.94 MIL/uL (ref 3.80–5.20)
RDW: 14.8 % — ABNORMAL HIGH (ref 11.5–14.5)
WBC: 21.1 10*3/uL — AB (ref 3.6–11.0)

## 2016-01-18 LAB — COMPREHENSIVE METABOLIC PANEL
ALK PHOS: 64 U/L (ref 38–126)
ALT: 7 U/L — AB (ref 14–54)
AST: 26 U/L (ref 15–41)
Albumin: 2.8 g/dL — ABNORMAL LOW (ref 3.5–5.0)
Anion gap: 22 — ABNORMAL HIGH (ref 5–15)
BUN: 25 mg/dL — AB (ref 6–20)
CALCIUM: 8.4 mg/dL — AB (ref 8.9–10.3)
CO2: 19 mmol/L — AB (ref 22–32)
CREATININE: 1.41 mg/dL — AB (ref 0.44–1.00)
Chloride: 98 mmol/L — ABNORMAL LOW (ref 101–111)
GFR calc non Af Amer: 32 mL/min — ABNORMAL LOW (ref >60)
GFR, EST AFRICAN AMERICAN: 37 mL/min — AB (ref >60)
Glucose, Bld: 357 mg/dL — ABNORMAL HIGH (ref 65–99)
Potassium: 3.8 mmol/L (ref 3.5–5.1)
SODIUM: 139 mmol/L (ref 135–145)
Total Bilirubin: 1.8 mg/dL — ABNORMAL HIGH (ref 0.3–1.2)
Total Protein: 6.4 g/dL — ABNORMAL LOW (ref 6.5–8.1)

## 2016-01-18 LAB — TYPE AND SCREEN
ABO/RH(D): A POS
Antibody Screen: NEGATIVE

## 2016-01-18 LAB — MAGNESIUM: MAGNESIUM: 1.6 mg/dL — AB (ref 1.7–2.4)

## 2016-01-18 LAB — PROTIME-INR
INR: 1.19
PROTHROMBIN TIME: 15.2 s (ref 11.4–15.2)

## 2016-01-18 LAB — TROPONIN I: TROPONIN I: 1.41 ng/mL — AB (ref <0.03)

## 2016-01-18 LAB — LIPASE, BLOOD: Lipase: 17 U/L (ref 11–51)

## 2016-01-18 MED ORDER — SCOPOLAMINE 1 MG/3DAYS TD PT72
1.0000 | MEDICATED_PATCH | TRANSDERMAL | Status: DC
  Administered 2016-01-18: 1.5 mg via TRANSDERMAL
  Filled 2016-01-18: qty 1

## 2016-01-18 MED ORDER — AMIODARONE HCL IN DEXTROSE 360-4.14 MG/200ML-% IV SOLN: 30.0000 mg/h | INTRAVENOUS | Status: DC

## 2016-01-18 MED ORDER — AMIODARONE HCL IN DEXTROSE 360-4.14 MG/200ML-% IV SOLN
INTRAVENOUS | Status: AC
  Administered 2016-01-18: 150 mg via INTRAVENOUS
  Filled 2016-01-18: qty 200

## 2016-01-18 MED ORDER — INSULIN ASPART 100 UNIT/ML ~~LOC~~ SOLN: 0.0000 [IU] | Freq: Every day | SUBCUTANEOUS | Status: DC

## 2016-01-18 MED ORDER — DEXTROSE 5 % IV SOLN: 2.0000 g | INTRAVENOUS | Status: DC

## 2016-01-18 MED ORDER — LORAZEPAM 2 MG/ML PO CONC
1.0000 mg | ORAL | Status: DC | PRN
  Filled 2016-01-18: qty 0.5

## 2016-01-18 MED ORDER — QUETIAPINE FUMARATE 25 MG PO TABS: 12.5000 mg | ORAL_TABLET | Freq: Two times a day (BID) | ORAL | Status: DC

## 2016-01-18 MED ORDER — CEFTRIAXONE SODIUM-DEXTROSE 2-2.22 GM-% IV SOLR
2.0000 g | INTRAVENOUS | Status: DC
  Filled 2016-01-18: qty 50

## 2016-01-18 MED ORDER — AMIODARONE LOAD VIA INFUSION
150.0000 mg | Freq: Once | INTRAVENOUS | Status: AC
  Administered 2016-01-18: 150 mg via INTRAVENOUS
  Filled 2016-01-18: qty 83.34

## 2016-01-18 MED ORDER — MORPHINE SULFATE (CONCENTRATE) 10 MG/0.5ML PO SOLN: 5.0000 mg | ORAL | Status: DC | PRN

## 2016-01-18 MED ORDER — SODIUM CHLORIDE 0.9% FLUSH: 3.0000 mL | Freq: Two times a day (BID) | INTRAVENOUS | Status: DC

## 2016-01-18 MED ORDER — ONDANSETRON HCL 4 MG/2ML IJ SOLN
4.0000 mg | Freq: Four times a day (QID) | INTRAMUSCULAR | Status: DC | PRN
  Filled 2016-01-18: qty 2

## 2016-01-18 MED ORDER — SODIUM CHLORIDE 0.9 % IV SOLN
8.0000 mg/h | INTRAVENOUS | Status: DC
  Filled 2016-01-18: qty 80

## 2016-01-18 MED ORDER — PANTOPRAZOLE SODIUM 40 MG IV SOLR: 40.0000 mg | Freq: Two times a day (BID) | INTRAVENOUS | Status: DC

## 2016-01-18 MED ORDER — ACETAMINOPHEN 650 MG RE SUPP
650.0000 mg | Freq: Four times a day (QID) | RECTAL | Status: DC | PRN
Start: 2016-01-18 — End: 2016-01-20
  Administered 2016-01-19: 06:00:00 650 mg via RECTAL
  Filled 2016-01-18: qty 1

## 2016-01-18 MED ORDER — SODIUM CHLORIDE 0.9 % IV SOLN: INTRAVENOUS | Status: DC

## 2016-01-18 MED ORDER — MORPHINE SULFATE (PF) 4 MG/ML IV SOLN
1.0000 mg | INTRAVENOUS | Status: DC | PRN
  Administered 2016-01-18 – 2016-01-20 (×9): 1 mg via INTRAVENOUS
  Filled 2016-01-18 (×8): qty 1

## 2016-01-18 MED ORDER — METOPROLOL TARTRATE 25 MG PO TABS: 25.0000 mg | ORAL_TABLET | Freq: Two times a day (BID) | ORAL | Status: DC

## 2016-01-18 MED ORDER — INSULIN ASPART 100 UNIT/ML ~~LOC~~ SOLN: 8.0000 [IU] | Freq: Once | SUBCUTANEOUS | Status: DC

## 2016-01-18 MED ORDER — VANCOMYCIN HCL IN DEXTROSE 1-5 GM/200ML-% IV SOLN
INTRAVENOUS | Status: AC
  Filled 2016-01-18: qty 200

## 2016-01-18 MED ORDER — ONDANSETRON HCL 4 MG PO TABS: 4.0000 mg | ORAL_TABLET | Freq: Four times a day (QID) | ORAL | Status: DC | PRN

## 2016-01-18 MED ORDER — INSULIN NPH (HUMAN) (ISOPHANE) 100 UNIT/ML ~~LOC~~ SUSP: 10.0000 [IU] | Freq: Two times a day (BID) | SUBCUTANEOUS | Status: DC

## 2016-01-18 MED ORDER — PIPERACILLIN-TAZOBACTAM 3.375 G IVPB
INTRAVENOUS | Status: AC
  Filled 2016-01-18: qty 50

## 2016-01-18 MED ORDER — LORAZEPAM 1 MG PO TABS: 1.0000 mg | ORAL_TABLET | ORAL | Status: DC | PRN

## 2016-01-18 MED ORDER — ALPRAZOLAM 0.25 MG PO TABS: 0.2500 mg | ORAL_TABLET | ORAL | Status: DC | PRN

## 2016-01-18 MED ORDER — AMIODARONE HCL IN DEXTROSE 360-4.14 MG/200ML-% IV SOLN
60.0000 mg/h | INTRAVENOUS | Status: DC
  Administered 2016-01-18: 60 mg/h via INTRAVENOUS

## 2016-01-18 MED ORDER — MORPHINE SULFATE (PF) 4 MG/ML IV SOLN
2.0000 mg | Freq: Once | INTRAVENOUS | Status: AC
  Administered 2016-01-18: 2 mg via INTRAVENOUS
  Filled 2016-01-18: qty 1

## 2016-01-18 MED ORDER — SODIUM CHLORIDE 0.9 % IV BOLUS (SEPSIS)
1000.0000 mL | Freq: Once | INTRAVENOUS | Status: AC
  Administered 2016-01-18: 1000 mL via INTRAVENOUS

## 2016-01-18 MED ORDER — ACETAMINOPHEN 325 MG PO TABS: 650.0000 mg | ORAL_TABLET | Freq: Four times a day (QID) | ORAL | Status: DC | PRN

## 2016-01-18 MED ORDER — LORAZEPAM 2 MG/ML IJ SOLN
1.0000 mg | INTRAMUSCULAR | Status: DC | PRN
  Administered 2016-01-19: 1 mg via INTRAVENOUS
  Filled 2016-01-18: qty 1

## 2016-01-18 MED ORDER — AMIODARONE IV BOLUS ONLY 150 MG/100ML
INTRAVENOUS | Status: AC
  Filled 2016-01-18: qty 100

## 2016-01-18 MED ORDER — CEFTRIAXONE SODIUM-DEXTROSE 1-3.74 GM-% IV SOLR
INTRAVENOUS | Status: AC
  Filled 2016-01-18: qty 50

## 2016-01-18 MED ORDER — MORPHINE SULFATE (PF) 4 MG/ML IV SOLN
INTRAVENOUS | Status: AC
  Administered 2016-01-18: 1 mg via INTRAVENOUS
  Filled 2016-01-18: qty 1

## 2016-01-18 MED ORDER — INSULIN ASPART 100 UNIT/ML ~~LOC~~ SOLN: 0.0000 [IU] | Freq: Three times a day (TID) | SUBCUTANEOUS | Status: DC

## 2016-01-18 MED ORDER — ONDANSETRON HCL 4 MG/2ML IJ SOLN
4.0000 mg | Freq: Once | INTRAMUSCULAR | Status: AC
  Administered 2016-01-18: 4 mg via INTRAVENOUS
  Filled 2016-01-18: qty 2

## 2016-01-18 MED ORDER — ACETAMINOPHEN 650 MG RE SUPP: 975.0000 mg | Freq: Once | RECTAL | Status: DC

## 2016-01-18 MED ORDER — SODIUM CHLORIDE 0.9 % IV BOLUS (SEPSIS): 1000.0000 mL | Freq: Once | INTRAVENOUS | Status: DC

## 2016-01-18 MED ORDER — IPRATROPIUM-ALBUTEROL 0.5-2.5 (3) MG/3ML IN SOLN: 3.0000 mL | RESPIRATORY_TRACT | Status: DC | PRN

## 2016-01-18 MED ORDER — PANTOPRAZOLE SODIUM 40 MG IV SOLR
40.0000 mg | Freq: Once | INTRAVENOUS | Status: AC
  Administered 2016-01-18: 40 mg via INTRAVENOUS
  Filled 2016-01-18: qty 40

## 2016-01-18 MED ORDER — DULOXETINE HCL 60 MG PO CPEP: 60.0000 mg | ORAL_CAPSULE | Freq: Every day | ORAL | Status: DC

## 2016-01-18 MED ORDER — SODIUM CHLORIDE 0.9 % IV BOLUS (SEPSIS): 250.0000 mL | Freq: Once | INTRAVENOUS | Status: DC

## 2016-01-18 MED ORDER — PIPERACILLIN-TAZOBACTAM 3.375 G IVPB: 3.3750 g | Freq: Once | INTRAVENOUS | Status: DC

## 2016-01-18 MED ORDER — OXYCODONE HCL 5 MG PO TABS: 5.0000 mg | ORAL_TABLET | ORAL | Status: DC | PRN

## 2016-01-18 NOTE — ED Notes (Signed)
Lab at bedside

## 2016-01-18 NOTE — ED Notes (Signed)
Unable to obtain urine, foley placed and no urine returned.

## 2016-01-18 NOTE — ED Notes (Signed)
Dr. Dolores FrameSung notified of critical lab value - Troponin 1.41.  Acknowledged, no new orders.

## 2016-01-18 NOTE — ED Notes (Signed)
Hospice RN at bedside

## 2016-01-18 NOTE — ED Notes (Signed)
Multiple attempts for blood, lab notified.

## 2016-01-18 NOTE — Progress Notes (Signed)
ED visit made. Patient is currently followed by [facility] with a hospice diagnosis of hypertensive heart and kidney disease. She is a DNR code. Patient was sen to the ED this morning for evaluation of vomiting coffee ground emesis which did not respond to interventions tried at [facility].  Patient seen lying on the ED stretcher. Daughters Carney BernJean and Zella BallRobin at bedside, both emotional as attending physician Dr. Clint GuyHower had just been in the room to discuss her prognosis. Patient was just made comfort care. Writer provided emotional support and also offered a transfer back to [facility] or to the hospice home for end of life care. Both felt she was too unstable to move. Blood pressure low, heart rate currently controlled with amiodarone drip, but remains elevated, drip to be discontinued. Writer assisted staff RN Katie in removing cardiac monitoring. Foley catheter in place with no out put. Ms. Chandra BatchShively did open her eyes when care was provided, no verbal response noted. She did groan and grimace when her right was moved. Feet mottled. Hospice team notified. Hospital care team all aware patient is currently followed by hospice. Thank you. Dayna BarkerKaren Robertson RN, BSN, [facility], [facility] [phone] c

## 2016-01-18 NOTE — H&P (Signed)
[facility] - [place] [facility]   PATIENT NAME: [name]    MR#:  [id number]  DATE OF BIRTH:  1926-01-06   DATE OF ADMISSION:  01/21/2016  PRIMARY CARE PHYSICIAN: Lauro RegulusANDERSON,MARSHALL W., MD   REQUESTING/REFERRING PHYSICIAN: Dolores FrameSung  CHIEF COMPLAINT:   Chief Complaint  Patient presents with  . Hematemesis    HISTORY OF PRESENT ILLNESS:  Ashley Bertholdllen Mulhall  is a 82 y.o. female with a known history of copd, type 2 diabetes Insulin requiring presenting from nursing facility with vomiting. Patient unable to provide information given mental status medical condition history from daughter is present at bedside  Follow-up nursing facility patient had episode of vomiting with coffee-ground emesis brought to Hospital further workup and evaluation.   Emergency department course: Patient noted to meet septic criteria started on code sepsis Upon my first examination patient in vtach heart rate 250    PAST MEDICAL HISTORY:   Past Medical History:  Diagnosis Date  . Collagen vascular disease (HCC)   . COPD (chronic obstructive pulmonary disease) (HCC)   . Coronary artery disease   . Diabetes mellitus without complication (HCC)   . Hyperlipidemia   . Hypertension   . Renal disorder     PAST SURGICAL HISTORY:   Past Surgical History:  Procedure Laterality Date  . CESAREAN SECTION      SOCIAL HISTORY:   Social History  Substance Use Topics  . Smoking status: Never Smoker  . Smokeless tobacco: Never Used  . Alcohol use No    FAMILY HISTORY:   Family History  Problem Relation Age of Onset  . Diabetes Other     DRUG ALLERGIES:  No Known Allergies  REVIEW OF SYSTEMS:  Unable to obtain given mental status medical condition  MEDICATIONS AT HOME:   Prior to Admission medications   Medication Sig Start Date End Date Taking? Authorizing Provider  acetaminophen (TYLENOL) 325 MG tablet Take 650 mg by mouth every 8 (eight) hours.   Yes Historical  Provider, MD  ALPRAZolam (XANAX) 0.25 MG tablet Take 0.25 mg by mouth every 4 (four) hours as needed for anxiety.   Yes Historical Provider, MD  DULoxetine (CYMBALTA) 60 MG capsule Take 60 mg by mouth daily.   Yes Historical Provider, MD  guaiFENesin (ROBITUSSIN) 100 MG/5ML liquid Take 200 mg by mouth every 4 (four) hours as needed for cough.   Yes Historical Provider, MD  insulin NPH Human (HUMULIN N,NOVOLIN N) 100 UNIT/ML injection Inject 10-22 Units into the skin 2 (two) times daily. Give 22 units every morning and 10 units in the evening.   Yes Historical Provider, MD  insulin regular (NOVOLIN R,HUMULIN R) 100 units/mL injection Inject 3-8 Units into the skin as needed for high blood sugar. Per sliding scale: If blood sugar 251-325 give 3 units, 326-450 give 6 units, >450 give 8 units   Yes Historical Provider, MD  ipratropium-albuterol (DUONEB) 0.5-2.5 (3) MG/3ML SOLN Take 3 mLs by nebulization every 4 (four) hours as needed.   Yes Historical Provider, MD  loperamide (IMODIUM) 2 MG capsule Take 2-4 mg by mouth as needed for diarrhea or loose stools. Reported on 03/18/2015   Yes Historical Provider, MD  magnesium hydroxide (MILK OF MAGNESIA) 400 MG/5ML suspension Take 30 mLs by mouth every 4 (four) hours as needed for mild constipation.   Yes Historical Provider, MD  metoprolol tartrate (LOPRESSOR) 25 MG tablet Take 25 mg by mouth 2 (two) times daily.   Yes Historical Provider, MD  nystatin cream (MYCOSTATIN) Apply 1 application topically 3 (three) times daily. Apply to corners of mouth as needed   Yes Historical Provider, MD  omeprazole (PRILOSEC) 40 MG capsule Take 40 mg by mouth 2 (two) times daily.   Yes Historical Provider, MD  ondansetron (ZOFRAN) 4 MG tablet Take 4 mg by mouth daily.   Yes Historical Provider, MD  phenylephrine-shark liver oil-mineral oil-petrolatum (PREPARATION H) 0.25-14-74.9 % rectal ointment Place 1 application rectally as needed for hemorrhoids.   Yes Historical Provider,  MD  potassium chloride (KLOR-CON) 20 MEQ packet Take 20 mEq by mouth daily.   Yes Historical Provider, MD  prochlorperazine (COMPAZINE) 25 MG suppository Place 25 mg rectally every 4 (four) hours as needed for nausea or vomiting.   Yes Historical Provider, MD  promethazine (PHENERGAN) 25 MG tablet Take 25 mg by mouth every 4 (four) hours as needed for nausea or vomiting.   Yes Historical Provider, MD  QUEtiapine (SEROQUEL) 25 MG tablet Take 12.5 mg by mouth 2 (two) times daily.   Yes Historical Provider, MD  scopolamine (TRANSDERM-SCOP) 1 MG/3DAYS Place 1 patch onto the skin every 3 (three) days.   Yes Historical Provider, MD  senna-docusate (SENOKOT-S) 8.6-50 MG tablet Take 1 tablet by mouth 2 (two) times daily.   Yes Historical Provider, MD  sodium chloride (OCEAN) 0.65 % SOLN nasal spray Place 2 sprays into both nostrils as needed for congestion.   Yes Historical Provider, MD  torsemide (DEMADEX) 10 MG tablet Take 10 mg by mouth daily.    Yes Historical Provider, MD  traMADol (ULTRAM) 50 MG tablet Take 50 mg by mouth every 4 (four) hours as needed.   Yes Historical Provider, MD  trolamine salicylate (ASPERCREME) 10 % cream Apply 1 application topically 4 (four) times daily.   Yes Historical Provider, MD  witch hazel-glycerin (TUCKS) pad Apply 1-2 application topically as needed for itching.   Yes Historical Provider, MD      VITAL SIGNS:  Blood pressure 104/67, pulse (!) 154, temperature (!) 101.7 F (38.7 C), temperature source Rectal, resp. rate (!) 25, height 5\' 1"  (1.549 m), weight 66.7 kg (147 lb), SpO2 99 %.  PHYSICAL EXAMINATION:   VITAL SIGNS: Vitals:   01-25-2016 0630 Jan 25, 2016 0733  BP: 104/67   Pulse:    Resp: (!) 25   Temp:  (!) 101.7 F (38.7 C)   GENERAL:82 y.o.female critically ill  HEAD: Normocephalic, atraumatic.  EYES: Pupils equal, round, reactive to light. Unable to assess extraocular muscles given mental status/medical condition. No scleral icterus.  MOUTH: Dry  mucosal membrane. Dentition poor. No abscess noted.  EAR, NOSE, THROAT: Clear without exudates. No external lesions.  NECK: Supple. No thyromegaly. No nodules. No JVD.  PULMONARY: Clear to ascultation, without wheeze rails or rhonci. No use of accessory muscles, Good respiratory effort. good air entry bilaterally CHEST: Nontender to palpation.  CARDIOVASCULAR: S1 and S2. Irregular rate and rhythm. No murmurs, rubs, or gallops. No edema. Pedal pulses 2+ bilaterally.  GASTROINTESTINAL: Soft, nontender, nondistended. No masses. Positive bowel sounds. No hepatosplenomegaly.  MUSCULOSKELETAL: No swelling, clubbing, or edema. Range of motion full in all extremities.  NEUROLOGIC: Unable to assess given mental status/medical condition SKIN: No ulceration, lesions, rashes, or cyanosis. Skin warm and dry. Turgor intact.  PSYCHIATRIC: Unable to assess given mental status/medical condition     LABORATORY PANEL:   CBC  Recent Labs Lab 01-25-16 0605  WBC 21.1*  HGB 12.9  HCT 38.8  PLT 359   ------------------------------------------------------------------------------------------------------------------  Chemistries   Recent Labs Lab 04-11-2015 0605  NA 139  K 3.8  CL 98*  CO2 19*  GLUCOSE 357*  BUN 25*  CREATININE 1.41*  CALCIUM 8.4*  AST 26  ALT 7*  ALKPHOS 64  BILITOT 1.8*   ------------------------------------------------------------------------------------------------------------------  Cardiac Enzymes  Recent Labs Lab 04-11-2015 0605  TROPONINI 1.41*   ------------------------------------------------------------------------------------------------------------------  RADIOLOGY:  Dg Chest Port 1 View  Result Date: 02/06/2016 CLINICAL DATA:  Coffee-ground emesis. EXAM: PORTABLE CHEST 1 VIEW COMPARISON:  02/16/2014 FINDINGS: The heart size and mediastinal contours are within normal limits. Both lungs are clear. The visualized skeletal structures are unremarkable. Right  upper quadrant abdominal calcification likely represents a gallstone. IMPRESSION: No acute cardiopulmonary findings. Electronically Signed   By: Ellery Plunkaniel R Mitchell M.D.   On: 2015-08-20 06:48    EKG:   Orders placed or performed during the hospital encounter of 04-11-2015  . ED EKG  . ED EKG  . EKG 12-Lead  . EKG 12-Lead    IMPRESSION AND PLAN:   82 year old Caucasian female DO NOT RESUSCITATE history of dementia diabetes presenting after coffee-ground emesis  1. Ventricular tachycardia: Start amiodarone bolus, drip, check magnesium and replace to greater than 2 2. NSTEMI: Elevated troponin in setting of tachycardia potentially related to demand. Would ideally start heparin will hold given bleeding, place on telemetry trend enzymes 3. Upper GI bleed: IV Protonix twice a day trend hemoglobin every 6 hours 4. Sepsis: Meeting septic criteria by heart rate is temperature leukocytosis secondary to urinary tract infection ceftriaxone, follow culture data and adjust antibiotics accordingly continue IV fluids possible 5. Acute kidney injury: IV fluids as tolerated and avoid further nephrotoxic agents 6. Type 2 diabetes insulin requiring continue basal insulin at sliding scale coverage 7. Venous thrombi embolism prophylactic: SCDs    All the records are reviewed and case discussed with ED provider. Management plans discussed with the patient, family and they are in agreement.  CODE STATUS: DO NOT RESUSCITATE  TOTAL TIME TAKING CARE OF THIS PATIENT: 61 critical care minutes.    Kimi Bordeau,  Mardi MainlandDavid K M.D on 02/03/2016 at 8:18 AM  Between 7am to 6pm - Pager - [phone]  After 6pm: House Pager: - [phone]  [facility] [place] [facility]  Office  [phone]  CC: Primary care physician; Lauro RegulusANDERSON,MARSHALL W., MD

## 2016-01-18 NOTE — ED Provider Notes (Signed)
[facility] Emergency Department Provider Note   ____________________________________________   First MD Initiated Contact with Patient 02/07/2016 0600     (approximate)  I have reviewed the triage vital signs and the nursing notes.   HISTORY  Chief Complaint Hematemesis  History obtained via daughter  HPI Cammy Brochurellen W Lafauci is a 82 y.o. female brought to the ED from assisted living facility with a chief complaint of hematemesis. Staff reported to EMS that patient has been vomiting since midnight. Patient is a DO NOT RESUSCITATE currently on hospice. Daughter states she saw the patient yesterday and she was in her usual state of health. Ate a cheeseburger for dinner.Daughter denies recent fever, chest pain, shortness of breath, diarrhea. States patient has "good days and bad days" as far as her mental status and verbal state. Denies recent travel or trauma. Nothing makes her symptoms better or worse.   Past Medical History:  Diagnosis Date  . Collagen vascular disease (HCC)   . COPD (chronic obstructive pulmonary disease) (HCC)   . Coronary artery disease   . Diabetes mellitus without complication (HCC)   . Hyperlipidemia   . Hypertension   . Renal disorder     Patient Active Problem List   Diagnosis Date Noted  . Sepsis (HCC) 02/12/2016  . Elevated troponin I level 01/26/2016  . Upper GI bleed 02/09/2016  . Senile dementia, uncomplicated 10/21/2015  . Diabetes mellitus with other specified manifestations 10/21/2015    Past Surgical History:  Procedure Laterality Date  . CESAREAN SECTION      Prior to Admission medications   Medication Sig Start Date End Date Taking? Authorizing Provider  acetaminophen (TYLENOL) 325 MG tablet Take 650 mg by mouth every 8 (eight) hours.   Yes Historical Provider, MD  ALPRAZolam (XANAX) 0.25 MG tablet Take 0.25 mg by mouth every 4 (four) hours as needed for anxiety.   Yes Historical Provider, MD  DULoxetine  (CYMBALTA) 60 MG capsule Take 60 mg by mouth daily.   Yes Historical Provider, MD  guaiFENesin (ROBITUSSIN) 100 MG/5ML liquid Take 200 mg by mouth every 4 (four) hours as needed for cough.   Yes Historical Provider, MD  insulin NPH Human (HUMULIN N,NOVOLIN N) 100 UNIT/ML injection Inject 10-22 Units into the skin 2 (two) times daily. Give 22 units every morning and 10 units in the evening.   Yes Historical Provider, MD  insulin regular (NOVOLIN R,HUMULIN R) 100 units/mL injection Inject 3-8 Units into the skin as needed for high blood sugar. Per sliding scale: If blood sugar 251-325 give 3 units, 326-450 give 6 units, >450 give 8 units   Yes Historical Provider, MD  ipratropium-albuterol (DUONEB) 0.5-2.5 (3) MG/3ML SOLN Take 3 mLs by nebulization every 4 (four) hours as needed.   Yes Historical Provider, MD  loperamide (IMODIUM) 2 MG capsule Take 2-4 mg by mouth as needed for diarrhea or loose stools. Reported on 03/18/2015   Yes Historical Provider, MD  magnesium hydroxide (MILK OF MAGNESIA) 400 MG/5ML suspension Take 30 mLs by mouth every 4 (four) hours as needed for mild constipation.   Yes Historical Provider, MD  metoprolol tartrate (LOPRESSOR) 25 MG tablet Take 25 mg by mouth 2 (two) times daily.   Yes Historical Provider, MD  nystatin cream (MYCOSTATIN) Apply 1 application topically 3 (three) times daily. Apply to corners of mouth as needed   Yes Historical Provider, MD  omeprazole (PRILOSEC) 40 MG capsule Take 40 mg by mouth 2 (two) times daily.  Yes Historical Provider, MD  ondansetron (ZOFRAN) 4 MG tablet Take 4 mg by mouth daily.   Yes Historical Provider, MD  phenylephrine-shark liver oil-mineral oil-petrolatum (PREPARATION H) 0.25-14-74.9 % rectal ointment Place 1 application rectally as needed for hemorrhoids.   Yes Historical Provider, MD  potassium chloride (KLOR-CON) 20 MEQ packet Take 20 mEq by mouth daily.   Yes Historical Provider, MD  prochlorperazine (COMPAZINE) 25 MG suppository  Place 25 mg rectally every 4 (four) hours as needed for nausea or vomiting.   Yes Historical Provider, MD  promethazine (PHENERGAN) 25 MG tablet Take 25 mg by mouth every 4 (four) hours as needed for nausea or vomiting.   Yes Historical Provider, MD  QUEtiapine (SEROQUEL) 25 MG tablet Take 12.5 mg by mouth 2 (two) times daily.   Yes Historical Provider, MD  scopolamine (TRANSDERM-SCOP) 1 MG/3DAYS Place 1 patch onto the skin every 3 (three) days.   Yes Historical Provider, MD  senna-docusate (SENOKOT-S) 8.6-50 MG tablet Take 1 tablet by mouth 2 (two) times daily.   Yes Historical Provider, MD  sodium chloride (OCEAN) 0.65 % SOLN nasal spray Place 2 sprays into both nostrils as needed for congestion.   Yes Historical Provider, MD  torsemide (DEMADEX) 10 MG tablet Take 10 mg by mouth daily.    Yes Historical Provider, MD  traMADol (ULTRAM) 50 MG tablet Take 50 mg by mouth every 4 (four) hours as needed.   Yes Historical Provider, MD  trolamine salicylate (ASPERCREME) 10 % cream Apply 1 application topically 4 (four) times daily.   Yes Historical Provider, MD  witch hazel-glycerin (TUCKS) pad Apply 1-2 application topically as needed for itching.   Yes Historical Provider, MD    Allergies Patient has no known allergies.  Family History  Problem Relation Age of Onset  . Diabetes Other     Social History Social History  Substance Use Topics  . Smoking status: Never Smoker  . Smokeless tobacco: Never Used  . Alcohol use No    Review of Systems  Constitutional: No fever/chills. Eyes: No visual changes. ENT: No sore throat. Cardiovascular: Denies chest pain. Respiratory: Denies shortness of breath. Gastrointestinal: No abdominal pain.  Positive for vomiting blood.  No diarrhea.  No constipation. Genitourinary: Negative for dysuria. Musculoskeletal: Negative for back pain. Skin: Negative for rash. Neurological: Negative for headaches, focal weakness or numbness.  10-point ROS otherwise  negative.  ____________________________________________   PHYSICAL EXAM:  VITAL SIGNS: ED Triage Vitals  Enc Vitals Group     BP Jan 24, 2016 0555 (!) 138/126     Pulse Rate 01/24/2016 0555 (!) 159     Resp 2016-01-24 0555 (!) 34     Temp --      Temp src --      SpO2 2016/01/24 0615 99 %     Weight 01-24-16 0556 147 lb (66.7 kg)     Height 2016/01/24 0556 5\' 1"  (1.549 m)     Head Circumference --      Peak Flow --      Pain Score --      Pain Loc --      Pain Edu? --      Excl. in GC? --     Constitutional: Alert. Chronically ill appearing and in moderate acute distress. Eyes: Conjunctivae are normal. PERRL. EOMI. Head: Atraumatic. Nose: No congestion/rhinnorhea. Mouth/Throat: Edentulous. Mucous membranes are moist.  Oropharynx non-erythematous. Neck: No stridor.   Cardiovascular: Tachycardic rate, regular rhythm. Grossly normal heart sounds.  Good peripheral circulation.  Respiratory: Normal respiratory effort.  No retractions. Lungs CTAB. Gastrointestinal: Soft and moderately tender to palpation epigastrium without rebound or guarding. No distention. No abdominal bruits. No CVA tenderness. Musculoskeletal: No lower extremity tenderness nor edema.  No joint effusions. Neurologic:  Moaning. MAEx4. No gross focal neurologic deficits are appreciated.  Skin:  Skin is warm, dry and intact. No rash noted. Psychiatric: Mood and affect are normal. Speech and behavior are normal.  ____________________________________________   LABS (all labs ordered are listed, but only abnormal results are displayed)  Labs Reviewed  CBC WITH DIFFERENTIAL/PLATELET - Abnormal; Notable for the following:       Result Value   WBC 21.1 (*)    RDW 14.8 (*)    Neutro Abs 18.7 (*)    All other components within normal limits  COMPREHENSIVE METABOLIC PANEL - Abnormal; Notable for the following:    Chloride 98 (*)    CO2 19 (*)    Glucose, Bld 357 (*)    BUN 25 (*)    Creatinine, Ser 1.41 (*)    Calcium  8.4 (*)    Total Protein 6.4 (*)    Albumin 2.8 (*)    ALT 7 (*)    Total Bilirubin 1.8 (*)    GFR calc non Af Amer 32 (*)    GFR calc Af Amer 37 (*)    Anion gap 22 (*)    All other components within normal limits  TROPONIN I - Abnormal; Notable for the following:    Troponin I 1.41 (*)    All other components within normal limits  CULTURE, BLOOD (ROUTINE X 2)  CULTURE, BLOOD (ROUTINE X 2)  LIPASE, BLOOD  PROTIME-INR  URINALYSIS COMPLETEWITH MICROSCOPIC ([facility] ONLY)  LACTIC ACID, PLASMA  LACTIC ACID, PLASMA  TYPE AND SCREEN   ____________________________________________  EKG  ED ECG REPORT I, SUNG,JADE J, the attending physician, personally viewed and interpreted this ECG.   Date: 02/10/2016  EKG Time: 0548  Rate: 163  Rhythm: SVT  Axis: LAD  Intervals:none  ST&T Change: Nonspecific  ____________________________________________  RADIOLOGY  Portable chest x-ray (viewed by me, interpreted per Dr. Clovis Riley): No acute cardiopulmonary findings. ____________________________________________   PROCEDURES  Procedure(s) performed: None  Procedures  Critical Care performed: Yes, see critical care note(s)   CRITICAL CARE Performed by: Irean Hong   Total critical care time: 45 minutes  Critical care time was exclusive of separately billable procedures and treating other patients.  Critical care was necessary to treat or prevent imminent or life-threatening deterioration.  Critical care was time spent personally by me on the following activities: development of treatment plan with patient and/or surrogate as well as nursing, discussions with consultants, evaluation of patient's response to treatment, examination of patient, obtaining history from patient or surrogate, ordering and performing treatments and interventions, ordering and review of laboratory studies, ordering and review of radiographic studies, pulse oximetry and re-evaluation of patient's  condition.  ____________________________________________   INITIAL IMPRESSION / ASSESSMENT AND PLAN / ED COURSE  Pertinent labs & imaging results that were available during my care of the patient were reviewed by me and considered in my medical decision making (see chart for details).  82 year old female who presents from nursing facility with hematemesis. She is tachycardic; EKG reveals SVT. Feel this is most likely secondary to dehydration. Will hold adenosine for now as patient receives IV fluid resuscitation. Patient is DO NOT RESUSCITATE. I have spoken at length with her daughter who is at bedside regarding her serious  condition and grim prognosis. There is no GI coverage at our facility today. I advised the daughter that if she wishes to proceed with procedure such as endoscopy, then we will need to transfer the patient. She is unsure and wishes to wait for her sister to discuss and make the decision. For now, she wishes for patient to continue to receive IV fluid resuscitation as well as IV antibiotics for her leukocytosis. Clinical Course as of Jan 18 751  Mon Jan 18, 2016  [id number] Patient's other daughter is now at bedside. We had an extensive family discussion between the 2 daughters and myself about patient's serious medical condition and grim prognosis. They have decided to not pursue invasive GI procedures such as endoscopy/colonoscopy. They have declined transfer and would like for patient to be admitted at this facility. They would like to continue IV fluids, antibiotics, and blood transfusion if necessary. Noted elevated bilirubin; review of chart demonstrates bilirubin was also elevated 10 months ago. Discussed with hospitalist who will evaluate patient in the emergency department for admission.  [JS]    Clinical Course User Index [JS] Irean HongJade J Sung, MD     ____________________________________________   FINAL CLINICAL IMPRESSION(S) / ED DIAGNOSES  Final diagnoses:  Hematemesis,  presence of nausea not specified  Gastrointestinal hemorrhage, unspecified gastrointestinal hemorrhage type  Hyperglycemia  Dehydration  Tachycardia  Sepsis, due to unspecified organism (HCC)  Fever, unspecified fever cause  Elevated troponin  Renal failure, unspecified chronicity      NEW MEDICATIONS STARTED DURING THIS VISIT:  New Prescriptions   No medications on file     Note:  This document was prepared using Dragon voice recognition software and may include unintentional dictation errors.    Irean HongJade J Sung, MD 01/17/2016 [phone]

## 2016-01-18 NOTE — ED Triage Notes (Signed)
[facility].  In treatment room, patient vomiting coffee ground emesis.

## 2016-01-18 NOTE — Progress Notes (Signed)
Continued decline - now hypotensive  Updated family at bedside After discussion now transition to comfort measures only

## 2016-01-18 NOTE — Progress Notes (Addendum)
   [facility] - [place] at [facility]   Advance care planning  Hospital Day: 0 days [name] is a 82 y.o. female presenting with Hematemesis .   Advance care planning discussed with patient  with additional Family at bedside. All questions in regards to overall condition and expected prognosis answered. The decision was made to continue current code status  CODE STATUS: dnr Time spent: 18 minutes   Discussed remarkably poor prognosis with family members in setting of multiple medical issues none the least ventricular tachycardia, NSTEMI, GI bleed, sepsis, renal failure  Family does not want aggressive measures: continue with medications as discussed in treatment plan does not want catheterization, endoscopy  I cautioned the family and the patient will likely die from all of the above issues, if she continues to decline we'll discuss comfort measures at that time

## 2016-01-18 NOTE — Progress Notes (Signed)
Numerous family members at bedside, patient appears comfortable just received a dose of morphine

## 2016-01-19 LAB — MRSA PCR SCREENING: MRSA by PCR: POSITIVE — AB

## 2016-01-19 NOTE — Progress Notes (Signed)
While rounding, CH made initial visit to room 126. Pt was non-responsive. Daughter, Sister and sisters husband were bedside. CH spoke with Daughter who was grateful that her mother was not in pain. Pt had recently transitioned to comfort care. CH provided the ministry of presence, empathetic listening and prayer. CH is available for follow up as needed.    01/19/16 1400  Clinical Encounter Type  Visited With Patient;Patient and family together  Visit Type Initial;Spiritual support;Critical Care  Referral From Nurse  Spiritual Encounters  Spiritual Needs Prayer;Emotional

## 2016-01-19 NOTE — Progress Notes (Signed)
Notified Dr. Clint GuyHower patient will need MRSA PCR per protocol due to history of +MRSA PCR February 2017. Family updated. Dr. Clint GuyHower stated since patient is comfort care if PCR comes back positive we will not treat. Will monitor for results and place on appropriate isolation if positive.

## 2016-01-19 NOTE — Progress Notes (Signed)
Visit made. Patient seen lying in bed, eyes slightly open, unfocused. Feet continue to be mottled, deeper colored  than at yesterday's visit, breathing very shallow. Several family members at bedside, emotional support and end of life education given. Family appreciative of visit. Chart notes reviewed. Patient has received 3 doses of PRN IV morphine since 3 am. Writer discussed with staff RN Skylar the possibility of moving Mrs. Chandra BatchShively to a corner room as there are a lot family present with her. Will continue to follow and update hospice team. Thank you. Dayna BarkerKaren Robertson RN, BSN, [facility], [facility] [phone] c

## 2016-01-19 NOTE — Progress Notes (Signed)
[facility] [place] [facility]   PATIENT NAME: [name]    MRN#:  [id number]  DATE OF BIRTH:  30-May-1925  SUBJECTIVE:  Hospital Day: 1 day [name] is a 82 y.o. female presenting with Hematemesis .   Overnight events: No overnight events Interval Events: Patient is comfort measures only, family members at bedside  REVIEW OF SYSTEMS:  Unable to obtain  DRUG ALLERGIES:  No Known Allergies  VITALS:  Blood pressure (!) 77/58, pulse 84, temperature (!) 100.5 F (38.1 C), temperature source Oral, resp. rate 14, height 5\' 1"  (1.549 m), weight 66.5 kg (146 lb 9.6 oz), SpO2 (!) 68 %.  PHYSICAL EXAMINATION:   VITAL SIGNS: Vitals:   01/19/16 0531 01/19/16 0707  BP:  (!) 77/58  Pulse:  84  Resp:    Temp: (!) 101 F (38.3 C) (!) 100.5 F (38.1 C)   GENERAL:82 y.o.female moderate distress given mental status.  HEAD: Normocephalic, atraumatic.  EYES: Pupils equal, round, reactive to light. Unable to assess extraocular muscles given mental status/medical condition. No scleral icterus.  MOUTH: Dry mucosal membrane. Dentition intact. No abscess noted.  EAR, NOSE, THROAT: Clear without exudates. No external lesions.  NECK: Supple. No thyromegaly. No nodules. No JVD.  PULMONARY: Clear to ascultation, without wheeze rails or rhonci. No use of accessory muscles, Good respiratory effort. good air entry bilaterally CHEST: Nontender to palpation.  CARDIOVASCULAR: S1 and S2. Regular rate and rhythm. No murmurs, rubs, or gallops. No edema. Pedal pulses 2+ bilaterally.  GASTROINTESTINAL: Soft, nontender, nondistended. No masses. Positive bowel sounds. No hepatosplenomegaly.  MUSCULOSKELETAL: No swelling, clubbing, or edema. Range of motion full in all extremities.  NEUROLOGIC: Unable to assess given mental status/medical condition SKIN: Mottled lower extremity is cool No ulceration, lesions, rashes,  . Turgor intact.  PSYCHIATRIC: Unable to assess given  mental status/medical condition       LABORATORY PANEL:   CBC  Recent Labs Lab February 11, 2016 0605  WBC 21.1*  HGB 12.9  HCT 38.8  PLT 359   ------------------------------------------------------------------------------------------------------------------  Chemistries   Recent Labs Lab February 11, 2016 0605  NA 139  K 3.8  CL 98*  CO2 19*  GLUCOSE 357*  BUN 25*  CREATININE 1.41*  CALCIUM 8.4*  MG 1.6*  AST 26  ALT 7*  ALKPHOS 64  BILITOT 1.8*   ------------------------------------------------------------------------------------------------------------------  Cardiac Enzymes  Recent Labs Lab February 11, 2016 0605  TROPONINI 1.41*   ------------------------------------------------------------------------------------------------------------------  RADIOLOGY:  Dg Chest Port 1 View  Result Date: 02/08/2016 CLINICAL DATA:  Coffee-ground emesis. EXAM: PORTABLE CHEST 1 VIEW COMPARISON:  02/16/2014 FINDINGS: The heart size and mediastinal contours are within normal limits. Both lungs are clear. The visualized skeletal structures are unremarkable. Right upper quadrant abdominal calcification likely represents a gallstone. IMPRESSION: No acute cardiopulmonary findings. Electronically Signed   By: Ellery Plunkaniel R Mitchell M.D.   On: 24-Jan-2016 06:48    EKG:   Orders placed or performed during the hospital encounter of February 11, 2016  . EKG 12-Lead  . EKG 12-Lead    ASSESSMENT AND PLAN:   [name] is a 82 y.o. female presenting with Hematemesis . Admitted 01/25/2016 : Day #: 1 day 1. Sepsis 2. NSTEMI 3. Ventricular tachycardia 4. Upper GI bleed 5. Acute renal failure  Patient is comfort measures only currently receiving as needed morphine hospice following, just received dose of morphine prior to my exam At this time appears comfortable if stabilized we'll discuss transfer to hospice facility  All the records are reviewed and  case discussed with Care Management/Social  Workerr. Management plans discussed with the patient, family and they are in agreement.  CODE STATUS: dnr TOTAL TIME TAKING CARE OF THIS PATIENT: 28 minutes.   POSSIBLE D/C IN 1DAYS, DEPENDING ON CLINICAL CONDITION.   Hower,  Mardi MainlandDavid K M.D on 01/19/2016 at 1:24 PM  Between 7am to 6pm - Pager - [phone]  After 6pm: House Pager: - [phone]  [facility]  Office  [phone]  CC: Primary care physician; Lauro RegulusANDERSON,MARSHALL W., MD

## 2016-01-19 NOTE — Progress Notes (Signed)
Nutrition Brief Note  82 y.o. female with a known history of copd, type 2 diabetes Insulin requiring presenting from nursing facility with coffee-ground emesis. Patient unable to provide information given mental status medical condition history from daughter is present at bedside. Found to have NSTEMI, Upper GI Bleed, Sepsis, AKI, and later became Hypotensive.  Patient identified to be seen by Malnutrition Screening Tool. Chart reviewed. Patient now transitioning to comfort care. Patient is NPO. She is followed [facility] [place] [facility].  No nutrition interventions warranted at this time. Please consult Dietitian as needed.   Helane RimaLeanne Kalix Meinecke, MS, RD, LDN Pager: [phone] After Hours Pager: [phone]

## 2016-02-15 NOTE — Progress Notes (Signed)
Family requested nurse, upon arrival to room pt noted without pulse and respirations, verified by D.H RN, pt expired, family at bedside, MD, supervisor and COPA made aware, pt not a donor

## 2016-02-15 NOTE — Progress Notes (Signed)
   [facility] [place] [facility]   PATIENT NAME: [name]    MR#:  [id number]  DATE OF BIRTH:  1925/03/17  DATE OF ADMISSION:  01/29/2016 ADMITTING PHYSICIAN: Wyatt Hasteavid K Hower, MD  DATE OF DISCHARGE: 06-14-2015  PRIMARY CARE PHYSICIAN: Lauro RegulusANDERSON,MARSHALL W., MD    ADMISSION DIAGNOSIS:  Dehydration [E86.0] Tachycardia [R00.0] Hyperglycemia [R73.9] Elevated troponin [R74.8] Sepsis, due to unspecified organism (HCC) [A41.9] Fever, unspecified fever cause [R50.9] Gastrointestinal hemorrhage, unspecified gastrointestinal hemorrhage type [K92.2] Hematemesis, presence of nausea not specified [K92.0] Renal failure, unspecified chronicity [N19]  DISCHARGE DIAGNOSIS:  Active Problems:   Sepsis (HCC)   NSTEMI   Upper GI bleed   Ventricular Tachycardia   Acute renal failure   SECONDARY DIAGNOSIS:   Past Medical History:  Diagnosis Date  . Collagen vascular disease (HCC)   . COPD (chronic obstructive pulmonary disease) (HCC)   . Coronary artery disease   . Diabetes mellitus without complication (HCC)   . Hyperlipidemia   . Hypertension   . Renal disorder     HOSPITAL COURSE:  [name]  is a 83 y.o. female admitted 02/12/2016 with chief complaint Hematemesis . Please see H&P performed by Wyatt Hasteavid K Hower, MD for further information. Patient presented with the above complaint, meeting septic criteria on arrival secondary to UTI. Her condition rapidly declined, noted elevated troponin, ventricular tachycardia, renal failure, hypotension. Given the poor overall outcome - she was transitioned to comfort measures. Kept comfortable throughout the rest of her hospital course.  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  Hower,  Mardi MainlandDavid K M.D on 02/06/2016 at 8:52 AM  Between 7am to 6pm - Pager - [phone]  After 6pm go to [email or web address] - Social research officer, governmentpassword [email or web address]  [facility] [place]  [facility]  Office  [phone]  CC: Primary care physician; Lauro RegulusANDERSON,MARSHALL W., MD

## 2016-02-15 NOTE — Progress Notes (Signed)
Visit made. Patient died at 8:10 am. Writer arrived at 9;10 am. Family all present, both daughters Zella BallRobin and Carney BernJean along with sisters and extended family one of which was a Education officer, environmentalpastor. Emotional support given. [employer] present to pick up body. Hospice team and triage notified. Thank you. Dayna BarkerKaren Robertson RN, BSN, [facility], hospital Liaison [phone] c

## 2016-02-15 DEATH — deceased

## 2017-04-10 IMAGING — CT CT ABD-PELV W/ CM
1 of 3 series · 14 of 32 positions shown, 19 images · IV contrast (omnipaque)
Comparison: CT abdomen and pelvis 05/16/2012.

CLINICAL DATA: Nausea for the past 2 days.  Initial encounter.

EXAM:
CT ABDOMEN AND PELVIS WITH CONTRAST
TECHNIQUE: Multidetector CT imaging of the abdomen and pelvis was performed
using the standard protocol following bolus administration of
intravenous contrast.
CONTRAST:  100 mL OMNIPAQUE IOHEXOL 300 MG/ML  SOLN

[Series 2: routine abd pel with · axial · 0.83mm/px · z∈[-520,-94]mm · 14 of 95 slices shown, 19 images]
[im 5/95  soft-tissue]
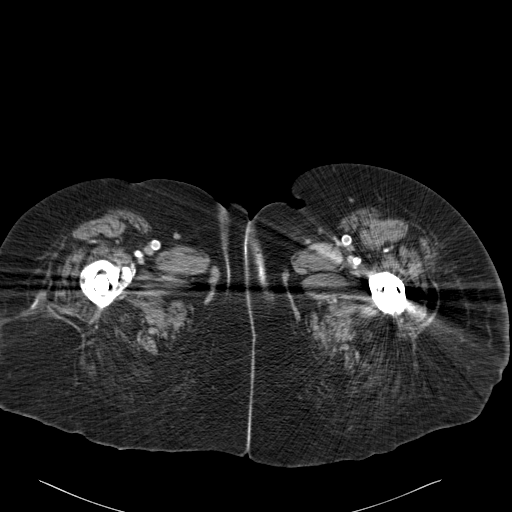
[im 5/95  bone]
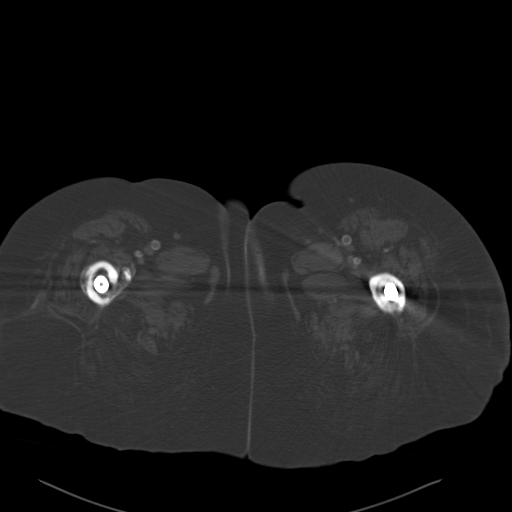
[im 15/95  soft-tissue]
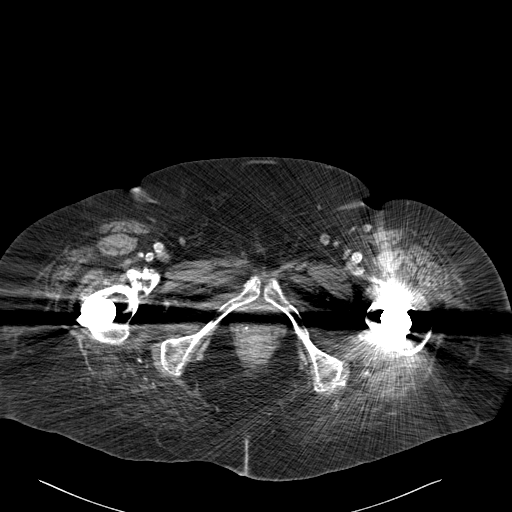
[im 19/95  soft-tissue]
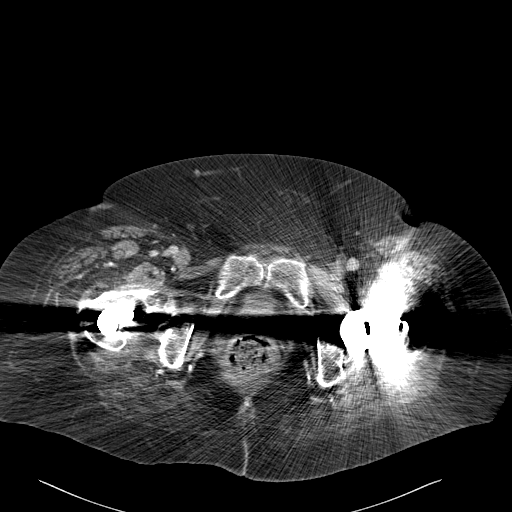
[im 29/95  soft-tissue]
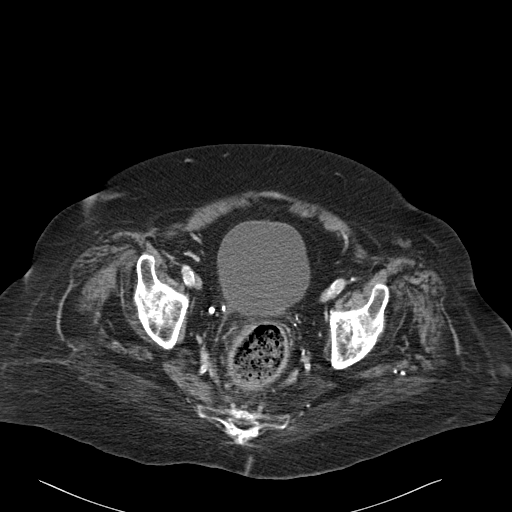
[im 38/95  soft-tissue]
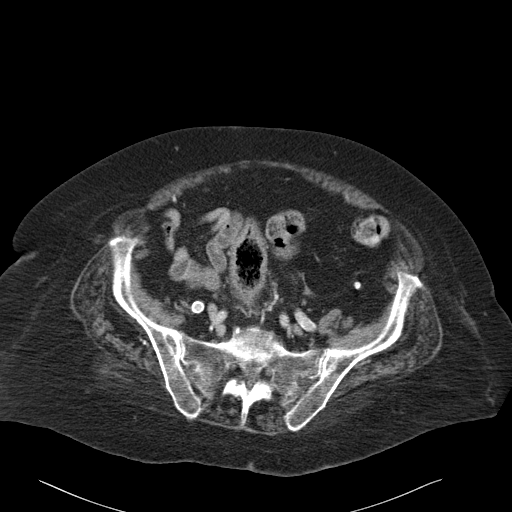
[im 43/95  soft-tissue]
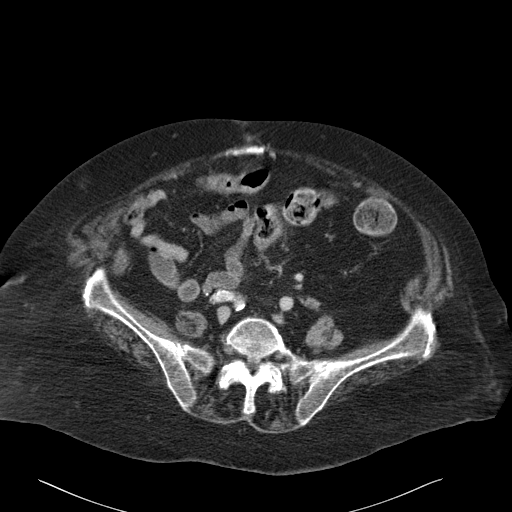
[im 52/95  soft-tissue]
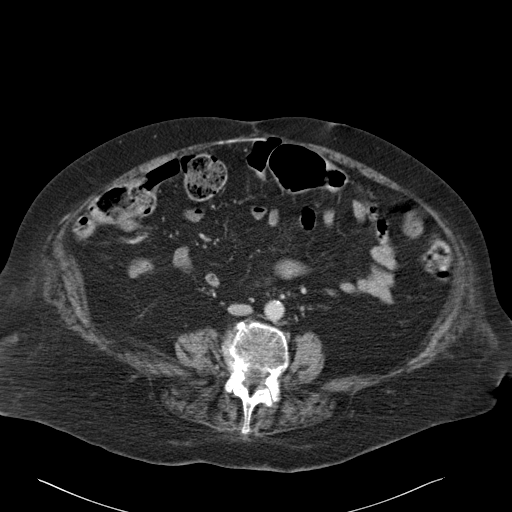
[im 57/95  soft-tissue]
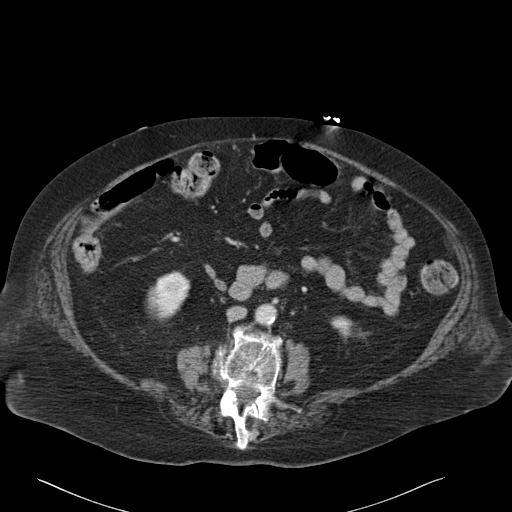
[im 62/95  soft-tissue]
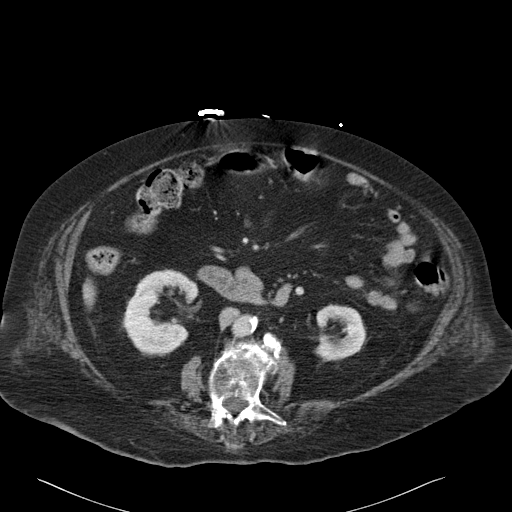
[im 62/95  bone]
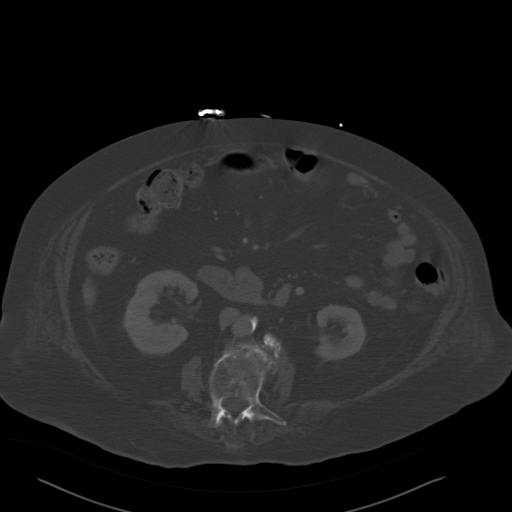
[im 71/95  soft-tissue]
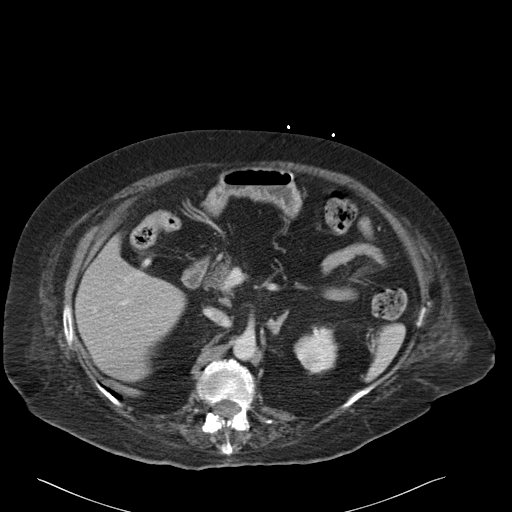
[im 76/95  soft-tissue]
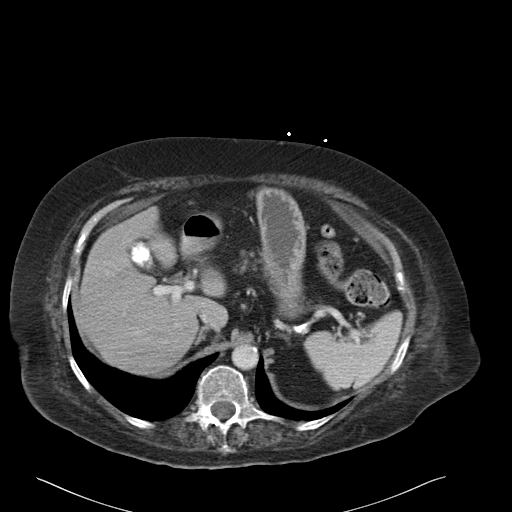
[im 76/95  lung]
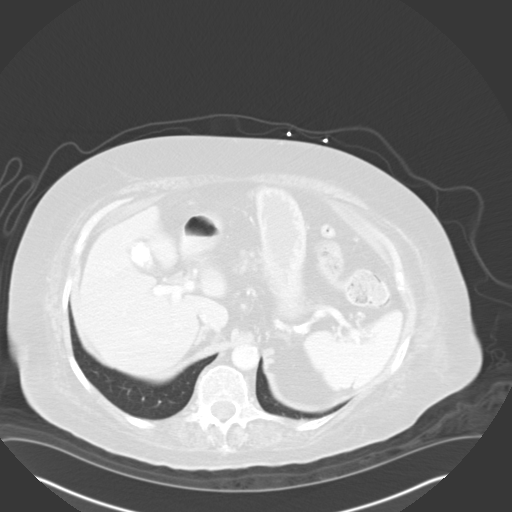
[im 80/95  soft-tissue]
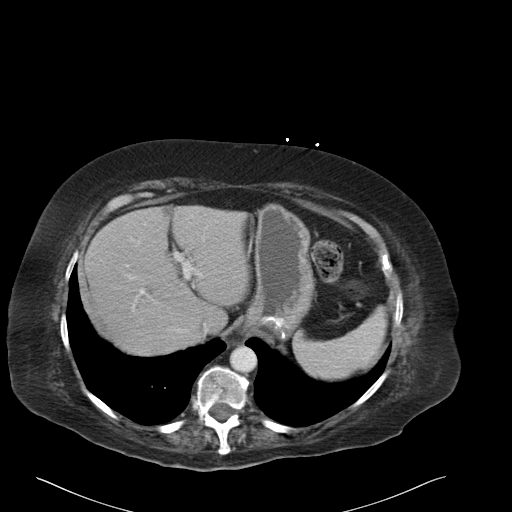
[im 80/95  lung]
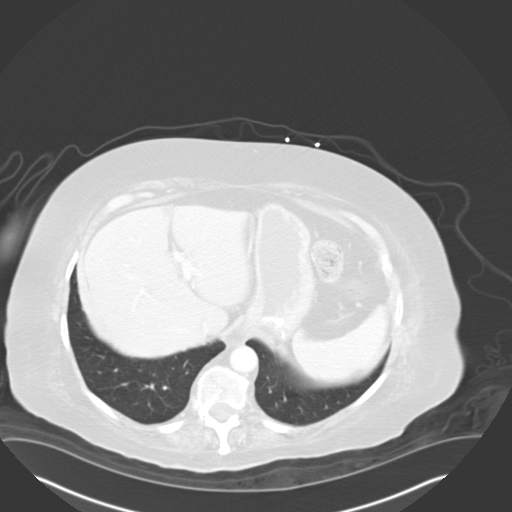
[im 85/95  lung]
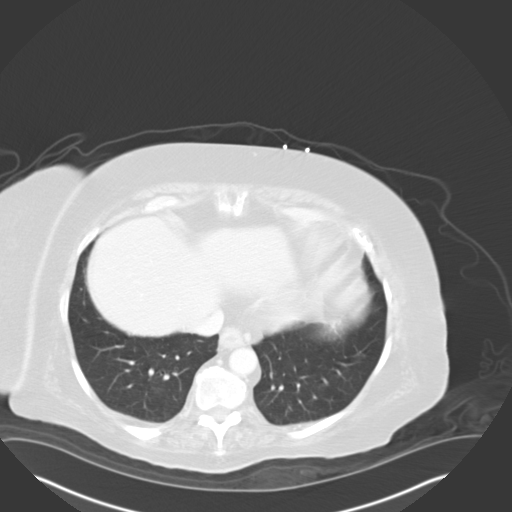
[im 90/95  soft-tissue]
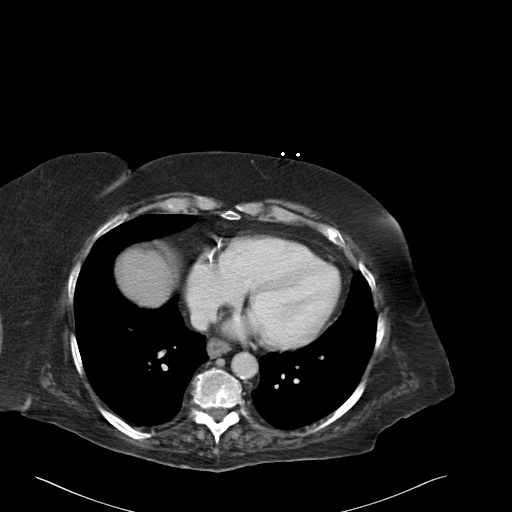
[im 90/95  lung]
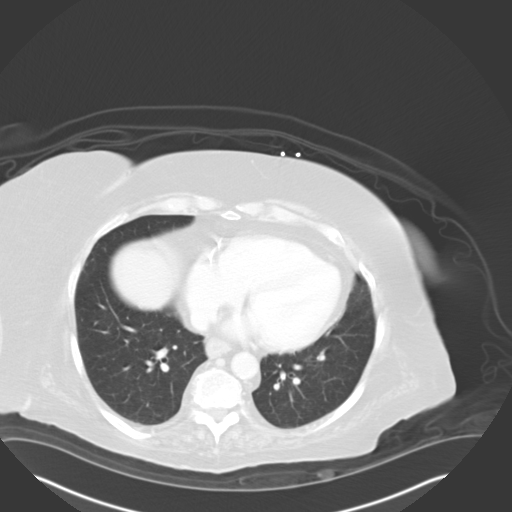

[14 of 32 positions shown; findings below may reference images not displayed]

FINDINGS: The lung bases are clear. No pleural or pericardial effusion. There
is cardiomegaly.

Calcification of the wall fundus of the gallbladder is unchanged. No
evidence of cholecystitis is identified. The liver, spleen, adrenal
glands, pancreas and right kidney are unremarkable. Left renal cyst
is unchanged. The

There is thickening of the walls of the distal sigmoid colon and
rectum with surrounding mild stranding. No diverticular disease
identified. Somewhat prominent stool burden in the rectosigmoid
colon is noted. The colon is otherwise unremarkable. The stomach and
small bowel appear normal. No pneumatosis, portal venous gas or free
intraperitoneal air is identified.

Extensive aortoiliac atherosclerosis without aneurysm is identified.
There is no lymphadenopathy or fluid collection. The uterus has been
removed.

The patient is status post left hip replacement and fixation of a
right hip fracture. Scoliosis and marked multilevel spondylosis are
noted. No lytic or sclerotic lesion is identified.
IMPRESSION: Findings most consistent with proctitis as described above. Negative
for abscess or CT signs of bowel ischemia.

Large gallstone versus gallbladder wall calcification, unchanged.

Atherosclerosis.
